# Patient Record
Sex: Male | Born: 1960
Health system: Southern US, Community
[De-identification: ages and names within clinical notes are randomized; demographics above are authoritative.]

## PROBLEM LIST (undated history)

## (undated) DIAGNOSIS — Z789 Other specified health status: Secondary | ICD-10-CM

---

## 2008-09-13 HISTORY — PX: VASECTOMY: SHX75

## 2010-09-13 HISTORY — PX: COLONOSCOPY: SHX174

## 2017-08-10 DIAGNOSIS — C44311 Basal cell carcinoma of skin of nose: Secondary | ICD-10-CM | POA: Diagnosis not present

## 2018-01-16 DIAGNOSIS — L259 Unspecified contact dermatitis, unspecified cause: Secondary | ICD-10-CM | POA: Diagnosis not present

## 2018-01-25 DIAGNOSIS — F5101 Primary insomnia: Secondary | ICD-10-CM | POA: Diagnosis not present

## 2018-01-25 DIAGNOSIS — E782 Mixed hyperlipidemia: Secondary | ICD-10-CM | POA: Diagnosis not present

## 2018-01-25 DIAGNOSIS — R0683 Snoring: Secondary | ICD-10-CM | POA: Diagnosis not present

## 2018-01-25 DIAGNOSIS — R5383 Other fatigue: Secondary | ICD-10-CM | POA: Diagnosis not present

## 2018-01-25 DIAGNOSIS — Z125 Encounter for screening for malignant neoplasm of prostate: Secondary | ICD-10-CM | POA: Diagnosis not present

## 2018-01-25 DIAGNOSIS — N529 Male erectile dysfunction, unspecified: Secondary | ICD-10-CM | POA: Diagnosis not present

## 2018-01-25 DIAGNOSIS — Z0000 Encounter for general adult medical examination without abnormal findings: Secondary | ICD-10-CM | POA: Diagnosis not present

## 2018-04-03 MED FILL — traZODone HCL 50 MG TABS: 50 | 90 days supply | Qty: 90 | Fill #0

## 2018-05-04 MED FILL — ATORVASTATIN CALCIUM 20 MG: 20 | 60 days supply | Qty: 60 | Fill #0

## 2018-07-05 MED FILL — ATORVASTATIN CALCIUM 20 MG: 20 | 60 days supply | Qty: 60 | Fill #1

## 2018-09-14 MED FILL — ATORVASTATIN CALCIUM 20 MG: 20 | 90 days supply | Qty: 90 | Fill #0

## 2018-10-19 DIAGNOSIS — E782 Mixed hyperlipidemia: Secondary | ICD-10-CM | POA: Diagnosis not present

## 2018-10-19 DIAGNOSIS — J069 Acute upper respiratory infection, unspecified: Secondary | ICD-10-CM | POA: Diagnosis not present

## 2018-10-19 DIAGNOSIS — R05 Cough: Secondary | ICD-10-CM | POA: Diagnosis not present

## 2018-10-25 DIAGNOSIS — R05 Cough: Secondary | ICD-10-CM | POA: Diagnosis not present

## 2018-10-25 DIAGNOSIS — J069 Acute upper respiratory infection, unspecified: Secondary | ICD-10-CM | POA: Diagnosis not present

## 2018-10-25 DIAGNOSIS — E782 Mixed hyperlipidemia: Secondary | ICD-10-CM | POA: Diagnosis not present

## 2019-01-11 DIAGNOSIS — M5386 Other specified dorsopathies, lumbar region: Secondary | ICD-10-CM | POA: Diagnosis not present

## 2019-01-11 DIAGNOSIS — M9903 Segmental and somatic dysfunction of lumbar region: Secondary | ICD-10-CM | POA: Diagnosis not present

## 2019-01-11 DIAGNOSIS — M9902 Segmental and somatic dysfunction of thoracic region: Secondary | ICD-10-CM | POA: Diagnosis not present

## 2019-01-11 DIAGNOSIS — M5417 Radiculopathy, lumbosacral region: Secondary | ICD-10-CM | POA: Diagnosis not present

## 2019-01-11 DIAGNOSIS — M9905 Segmental and somatic dysfunction of pelvic region: Secondary | ICD-10-CM | POA: Diagnosis not present

## 2019-01-11 DIAGNOSIS — M5136 Other intervertebral disc degeneration, lumbar region: Secondary | ICD-10-CM | POA: Diagnosis not present

## 2019-01-11 DIAGNOSIS — M62830 Muscle spasm of back: Secondary | ICD-10-CM | POA: Diagnosis not present

## 2019-01-12 DIAGNOSIS — M9902 Segmental and somatic dysfunction of thoracic region: Secondary | ICD-10-CM | POA: Diagnosis not present

## 2019-01-12 DIAGNOSIS — M5386 Other specified dorsopathies, lumbar region: Secondary | ICD-10-CM | POA: Diagnosis not present

## 2019-01-12 DIAGNOSIS — M9905 Segmental and somatic dysfunction of pelvic region: Secondary | ICD-10-CM | POA: Diagnosis not present

## 2019-01-12 DIAGNOSIS — M5417 Radiculopathy, lumbosacral region: Secondary | ICD-10-CM | POA: Diagnosis not present

## 2019-01-12 DIAGNOSIS — M9903 Segmental and somatic dysfunction of lumbar region: Secondary | ICD-10-CM | POA: Diagnosis not present

## 2019-01-12 DIAGNOSIS — M5136 Other intervertebral disc degeneration, lumbar region: Secondary | ICD-10-CM | POA: Diagnosis not present

## 2019-01-12 DIAGNOSIS — M62830 Muscle spasm of back: Secondary | ICD-10-CM | POA: Diagnosis not present

## 2019-01-16 DIAGNOSIS — M9902 Segmental and somatic dysfunction of thoracic region: Secondary | ICD-10-CM | POA: Diagnosis not present

## 2019-01-16 DIAGNOSIS — M62830 Muscle spasm of back: Secondary | ICD-10-CM | POA: Diagnosis not present

## 2019-01-16 DIAGNOSIS — M5386 Other specified dorsopathies, lumbar region: Secondary | ICD-10-CM | POA: Diagnosis not present

## 2019-01-16 DIAGNOSIS — M9903 Segmental and somatic dysfunction of lumbar region: Secondary | ICD-10-CM | POA: Diagnosis not present

## 2019-01-16 DIAGNOSIS — M5136 Other intervertebral disc degeneration, lumbar region: Secondary | ICD-10-CM | POA: Diagnosis not present

## 2019-01-16 DIAGNOSIS — M9905 Segmental and somatic dysfunction of pelvic region: Secondary | ICD-10-CM | POA: Diagnosis not present

## 2019-01-16 DIAGNOSIS — M5417 Radiculopathy, lumbosacral region: Secondary | ICD-10-CM | POA: Diagnosis not present

## 2019-01-19 DIAGNOSIS — M9903 Segmental and somatic dysfunction of lumbar region: Secondary | ICD-10-CM | POA: Diagnosis not present

## 2019-01-19 DIAGNOSIS — M9902 Segmental and somatic dysfunction of thoracic region: Secondary | ICD-10-CM | POA: Diagnosis not present

## 2019-01-19 DIAGNOSIS — M5386 Other specified dorsopathies, lumbar region: Secondary | ICD-10-CM | POA: Diagnosis not present

## 2019-01-19 DIAGNOSIS — M5417 Radiculopathy, lumbosacral region: Secondary | ICD-10-CM | POA: Diagnosis not present

## 2019-01-19 DIAGNOSIS — M9905 Segmental and somatic dysfunction of pelvic region: Secondary | ICD-10-CM | POA: Diagnosis not present

## 2019-01-19 DIAGNOSIS — M5136 Other intervertebral disc degeneration, lumbar region: Secondary | ICD-10-CM | POA: Diagnosis not present

## 2019-01-19 DIAGNOSIS — M62830 Muscle spasm of back: Secondary | ICD-10-CM | POA: Diagnosis not present

## 2019-01-23 MED FILL — ATORVASTATIN 20 MG TABLET: 20 | 90 days supply | Qty: 90 | Fill #0

## 2019-01-26 DIAGNOSIS — M9903 Segmental and somatic dysfunction of lumbar region: Secondary | ICD-10-CM | POA: Diagnosis not present

## 2019-01-26 DIAGNOSIS — M5136 Other intervertebral disc degeneration, lumbar region: Secondary | ICD-10-CM | POA: Diagnosis not present

## 2019-01-26 DIAGNOSIS — M9902 Segmental and somatic dysfunction of thoracic region: Secondary | ICD-10-CM | POA: Diagnosis not present

## 2019-01-26 DIAGNOSIS — M5386 Other specified dorsopathies, lumbar region: Secondary | ICD-10-CM | POA: Diagnosis not present

## 2019-01-26 DIAGNOSIS — M5417 Radiculopathy, lumbosacral region: Secondary | ICD-10-CM | POA: Diagnosis not present

## 2019-01-26 DIAGNOSIS — M62830 Muscle spasm of back: Secondary | ICD-10-CM | POA: Diagnosis not present

## 2019-01-26 DIAGNOSIS — M9905 Segmental and somatic dysfunction of pelvic region: Secondary | ICD-10-CM | POA: Diagnosis not present

## 2019-01-30 DIAGNOSIS — M5417 Radiculopathy, lumbosacral region: Secondary | ICD-10-CM | POA: Diagnosis not present

## 2019-01-30 DIAGNOSIS — M9905 Segmental and somatic dysfunction of pelvic region: Secondary | ICD-10-CM | POA: Diagnosis not present

## 2019-01-30 DIAGNOSIS — M5136 Other intervertebral disc degeneration, lumbar region: Secondary | ICD-10-CM | POA: Diagnosis not present

## 2019-01-30 DIAGNOSIS — M62830 Muscle spasm of back: Secondary | ICD-10-CM | POA: Diagnosis not present

## 2019-01-30 DIAGNOSIS — M9902 Segmental and somatic dysfunction of thoracic region: Secondary | ICD-10-CM | POA: Diagnosis not present

## 2019-01-30 DIAGNOSIS — M9903 Segmental and somatic dysfunction of lumbar region: Secondary | ICD-10-CM | POA: Diagnosis not present

## 2019-01-30 DIAGNOSIS — M5386 Other specified dorsopathies, lumbar region: Secondary | ICD-10-CM | POA: Diagnosis not present

## 2019-01-31 DIAGNOSIS — M9903 Segmental and somatic dysfunction of lumbar region: Secondary | ICD-10-CM | POA: Diagnosis not present

## 2019-01-31 DIAGNOSIS — M5136 Other intervertebral disc degeneration, lumbar region: Secondary | ICD-10-CM | POA: Diagnosis not present

## 2019-01-31 DIAGNOSIS — M5417 Radiculopathy, lumbosacral region: Secondary | ICD-10-CM | POA: Diagnosis not present

## 2019-01-31 DIAGNOSIS — M9905 Segmental and somatic dysfunction of pelvic region: Secondary | ICD-10-CM | POA: Diagnosis not present

## 2019-01-31 DIAGNOSIS — M5386 Other specified dorsopathies, lumbar region: Secondary | ICD-10-CM | POA: Diagnosis not present

## 2019-01-31 DIAGNOSIS — M9902 Segmental and somatic dysfunction of thoracic region: Secondary | ICD-10-CM | POA: Diagnosis not present

## 2019-01-31 DIAGNOSIS — M62830 Muscle spasm of back: Secondary | ICD-10-CM | POA: Diagnosis not present

## 2019-02-07 DIAGNOSIS — M9903 Segmental and somatic dysfunction of lumbar region: Secondary | ICD-10-CM | POA: Diagnosis not present

## 2019-02-07 DIAGNOSIS — M62830 Muscle spasm of back: Secondary | ICD-10-CM | POA: Diagnosis not present

## 2019-02-07 DIAGNOSIS — M9905 Segmental and somatic dysfunction of pelvic region: Secondary | ICD-10-CM | POA: Diagnosis not present

## 2019-02-07 DIAGNOSIS — M9902 Segmental and somatic dysfunction of thoracic region: Secondary | ICD-10-CM | POA: Diagnosis not present

## 2019-02-07 DIAGNOSIS — M5417 Radiculopathy, lumbosacral region: Secondary | ICD-10-CM | POA: Diagnosis not present

## 2019-02-07 DIAGNOSIS — M5386 Other specified dorsopathies, lumbar region: Secondary | ICD-10-CM | POA: Diagnosis not present

## 2019-02-07 DIAGNOSIS — M5136 Other intervertebral disc degeneration, lumbar region: Secondary | ICD-10-CM | POA: Diagnosis not present

## 2019-02-08 DIAGNOSIS — M62830 Muscle spasm of back: Secondary | ICD-10-CM | POA: Diagnosis not present

## 2019-02-08 DIAGNOSIS — M9902 Segmental and somatic dysfunction of thoracic region: Secondary | ICD-10-CM | POA: Diagnosis not present

## 2019-02-08 DIAGNOSIS — M5386 Other specified dorsopathies, lumbar region: Secondary | ICD-10-CM | POA: Diagnosis not present

## 2019-02-08 DIAGNOSIS — M5417 Radiculopathy, lumbosacral region: Secondary | ICD-10-CM | POA: Diagnosis not present

## 2019-02-08 DIAGNOSIS — M9905 Segmental and somatic dysfunction of pelvic region: Secondary | ICD-10-CM | POA: Diagnosis not present

## 2019-02-08 DIAGNOSIS — M9903 Segmental and somatic dysfunction of lumbar region: Secondary | ICD-10-CM | POA: Diagnosis not present

## 2019-02-08 DIAGNOSIS — M5136 Other intervertebral disc degeneration, lumbar region: Secondary | ICD-10-CM | POA: Diagnosis not present

## 2019-02-13 DIAGNOSIS — M5386 Other specified dorsopathies, lumbar region: Secondary | ICD-10-CM | POA: Diagnosis not present

## 2019-02-13 DIAGNOSIS — M9903 Segmental and somatic dysfunction of lumbar region: Secondary | ICD-10-CM | POA: Diagnosis not present

## 2019-02-13 DIAGNOSIS — M62830 Muscle spasm of back: Secondary | ICD-10-CM | POA: Diagnosis not present

## 2019-02-13 DIAGNOSIS — M5136 Other intervertebral disc degeneration, lumbar region: Secondary | ICD-10-CM | POA: Diagnosis not present

## 2019-02-13 DIAGNOSIS — M9905 Segmental and somatic dysfunction of pelvic region: Secondary | ICD-10-CM | POA: Diagnosis not present

## 2019-02-13 DIAGNOSIS — M9902 Segmental and somatic dysfunction of thoracic region: Secondary | ICD-10-CM | POA: Diagnosis not present

## 2019-02-13 DIAGNOSIS — M5417 Radiculopathy, lumbosacral region: Secondary | ICD-10-CM | POA: Diagnosis not present

## 2019-02-16 DIAGNOSIS — M5136 Other intervertebral disc degeneration, lumbar region: Secondary | ICD-10-CM | POA: Diagnosis not present

## 2019-02-16 DIAGNOSIS — M5386 Other specified dorsopathies, lumbar region: Secondary | ICD-10-CM | POA: Diagnosis not present

## 2019-02-16 DIAGNOSIS — M9903 Segmental and somatic dysfunction of lumbar region: Secondary | ICD-10-CM | POA: Diagnosis not present

## 2019-02-16 DIAGNOSIS — M9902 Segmental and somatic dysfunction of thoracic region: Secondary | ICD-10-CM | POA: Diagnosis not present

## 2019-02-16 DIAGNOSIS — M62830 Muscle spasm of back: Secondary | ICD-10-CM | POA: Diagnosis not present

## 2019-02-16 DIAGNOSIS — M5417 Radiculopathy, lumbosacral region: Secondary | ICD-10-CM | POA: Diagnosis not present

## 2019-02-16 DIAGNOSIS — M9905 Segmental and somatic dysfunction of pelvic region: Secondary | ICD-10-CM | POA: Diagnosis not present

## 2019-02-26 DIAGNOSIS — M5136 Other intervertebral disc degeneration, lumbar region: Secondary | ICD-10-CM | POA: Diagnosis not present

## 2019-02-26 DIAGNOSIS — M5417 Radiculopathy, lumbosacral region: Secondary | ICD-10-CM | POA: Diagnosis not present

## 2019-02-26 DIAGNOSIS — M5386 Other specified dorsopathies, lumbar region: Secondary | ICD-10-CM | POA: Diagnosis not present

## 2019-02-26 DIAGNOSIS — M9902 Segmental and somatic dysfunction of thoracic region: Secondary | ICD-10-CM | POA: Diagnosis not present

## 2019-02-26 DIAGNOSIS — M62830 Muscle spasm of back: Secondary | ICD-10-CM | POA: Diagnosis not present

## 2019-02-26 DIAGNOSIS — M9905 Segmental and somatic dysfunction of pelvic region: Secondary | ICD-10-CM | POA: Diagnosis not present

## 2019-02-26 DIAGNOSIS — M9903 Segmental and somatic dysfunction of lumbar region: Secondary | ICD-10-CM | POA: Diagnosis not present

## 2019-03-14 DIAGNOSIS — R202 Paresthesia of skin: Secondary | ICD-10-CM | POA: Diagnosis not present

## 2019-03-14 DIAGNOSIS — M4316 Spondylolisthesis, lumbar region: Secondary | ICD-10-CM | POA: Diagnosis not present

## 2019-03-14 DIAGNOSIS — M5416 Radiculopathy, lumbar region: Secondary | ICD-10-CM | POA: Diagnosis not present

## 2019-03-14 DIAGNOSIS — M545 Low back pain: Secondary | ICD-10-CM | POA: Diagnosis not present

## 2019-03-20 ENCOUNTER — Other Ambulatory Visit: Payer: Self-pay | Admitting: Neurosurgery

## 2019-03-20 ENCOUNTER — Other Ambulatory Visit (HOSPITAL_COMMUNITY): Payer: Self-pay | Admitting: Neurosurgery

## 2019-03-20 DIAGNOSIS — M5416 Radiculopathy, lumbar region: Secondary | ICD-10-CM

## 2019-04-05 ENCOUNTER — Other Ambulatory Visit: Payer: Self-pay

## 2019-04-05 ENCOUNTER — Ambulatory Visit (HOSPITAL_COMMUNITY)
Admission: RE | Admit: 2019-04-05 | Discharge: 2019-04-05 | Disposition: A | Discharge status: Home / Self Care | Payer: 59 | Source: Ambulatory Visit | Attending: Neurosurgery | Admitting: Neurosurgery

## 2019-04-05 DIAGNOSIS — M5416 Radiculopathy, lumbar region: Secondary | ICD-10-CM

## 2019-04-05 DIAGNOSIS — M545 Low back pain: Secondary | ICD-10-CM | POA: Diagnosis not present

## 2019-05-09 DIAGNOSIS — N529 Male erectile dysfunction, unspecified: Secondary | ICD-10-CM | POA: Diagnosis not present

## 2019-05-09 DIAGNOSIS — E782 Mixed hyperlipidemia: Secondary | ICD-10-CM | POA: Diagnosis not present

## 2019-05-09 DIAGNOSIS — F5101 Primary insomnia: Secondary | ICD-10-CM | POA: Diagnosis not present

## 2019-05-09 MED FILL — ATORVASTATIN 20 MG TABLET: 20 | 90 days supply | Qty: 90 | Fill #0

## 2019-05-31 DIAGNOSIS — M4317 Spondylolisthesis, lumbosacral region: Secondary | ICD-10-CM | POA: Diagnosis not present

## 2019-05-31 DIAGNOSIS — M5417 Radiculopathy, lumbosacral region: Secondary | ICD-10-CM | POA: Diagnosis not present

## 2019-05-31 DIAGNOSIS — M4306 Spondylolysis, lumbar region: Secondary | ICD-10-CM | POA: Diagnosis not present

## 2019-06-15 DIAGNOSIS — M5417 Radiculopathy, lumbosacral region: Secondary | ICD-10-CM | POA: Diagnosis not present

## 2019-06-28 DIAGNOSIS — M5417 Radiculopathy, lumbosacral region: Secondary | ICD-10-CM | POA: Diagnosis not present

## 2019-07-02 DIAGNOSIS — M5417 Radiculopathy, lumbosacral region: Secondary | ICD-10-CM | POA: Diagnosis not present

## 2019-08-13 DIAGNOSIS — M5417 Radiculopathy, lumbosacral region: Secondary | ICD-10-CM | POA: Diagnosis not present

## 2019-08-13 DIAGNOSIS — M5416 Radiculopathy, lumbar region: Secondary | ICD-10-CM | POA: Diagnosis not present

## 2019-08-13 MED FILL — ATORVASTATIN 20 MG TABLET: 20 | 90 days supply | Qty: 90 | Fill #1

## 2019-08-30 DIAGNOSIS — M4306 Spondylolysis, lumbar region: Secondary | ICD-10-CM | POA: Diagnosis not present

## 2019-08-30 DIAGNOSIS — M5417 Radiculopathy, lumbosacral region: Secondary | ICD-10-CM | POA: Diagnosis not present

## 2019-08-30 DIAGNOSIS — M4317 Spondylolisthesis, lumbosacral region: Secondary | ICD-10-CM | POA: Diagnosis not present

## 2019-09-28 DIAGNOSIS — M4317 Spondylolisthesis, lumbosacral region: Secondary | ICD-10-CM | POA: Diagnosis not present

## 2019-09-28 DIAGNOSIS — Z01812 Encounter for preprocedural laboratory examination: Secondary | ICD-10-CM | POA: Diagnosis not present

## 2019-09-28 DIAGNOSIS — M5417 Radiculopathy, lumbosacral region: Secondary | ICD-10-CM | POA: Diagnosis not present

## 2019-09-28 DIAGNOSIS — Z20822 Contact with and (suspected) exposure to covid-19: Secondary | ICD-10-CM | POA: Diagnosis not present

## 2019-10-08 DIAGNOSIS — M4802 Spinal stenosis, cervical region: Secondary | ICD-10-CM | POA: Diagnosis not present

## 2019-10-08 DIAGNOSIS — M4722 Other spondylosis with radiculopathy, cervical region: Secondary | ICD-10-CM | POA: Diagnosis not present

## 2019-10-17 DIAGNOSIS — M542 Cervicalgia: Secondary | ICD-10-CM | POA: Diagnosis not present

## 2019-10-17 DIAGNOSIS — R29898 Other symptoms and signs involving the musculoskeletal system: Secondary | ICD-10-CM | POA: Diagnosis not present

## 2019-10-17 DIAGNOSIS — M5412 Radiculopathy, cervical region: Secondary | ICD-10-CM | POA: Diagnosis not present

## 2019-10-22 DIAGNOSIS — M542 Cervicalgia: Secondary | ICD-10-CM | POA: Diagnosis not present

## 2019-10-22 DIAGNOSIS — M5412 Radiculopathy, cervical region: Secondary | ICD-10-CM | POA: Diagnosis not present

## 2019-10-22 DIAGNOSIS — M5417 Radiculopathy, lumbosacral region: Secondary | ICD-10-CM | POA: Diagnosis not present

## 2019-10-22 DIAGNOSIS — R29898 Other symptoms and signs involving the musculoskeletal system: Secondary | ICD-10-CM | POA: Diagnosis not present

## 2019-10-24 DIAGNOSIS — M5417 Radiculopathy, lumbosacral region: Secondary | ICD-10-CM | POA: Diagnosis not present

## 2019-10-24 DIAGNOSIS — M542 Cervicalgia: Secondary | ICD-10-CM | POA: Diagnosis not present

## 2019-10-24 DIAGNOSIS — R29898 Other symptoms and signs involving the musculoskeletal system: Secondary | ICD-10-CM | POA: Diagnosis not present

## 2019-10-24 DIAGNOSIS — M5412 Radiculopathy, cervical region: Secondary | ICD-10-CM | POA: Diagnosis not present

## 2019-11-08 DIAGNOSIS — R29898 Other symptoms and signs involving the musculoskeletal system: Secondary | ICD-10-CM | POA: Diagnosis not present

## 2019-11-08 DIAGNOSIS — M5417 Radiculopathy, lumbosacral region: Secondary | ICD-10-CM | POA: Diagnosis not present

## 2019-11-08 DIAGNOSIS — M4317 Spondylolisthesis, lumbosacral region: Secondary | ICD-10-CM | POA: Diagnosis not present

## 2019-11-08 DIAGNOSIS — M5412 Radiculopathy, cervical region: Secondary | ICD-10-CM | POA: Diagnosis not present

## 2019-11-08 DIAGNOSIS — M542 Cervicalgia: Secondary | ICD-10-CM | POA: Diagnosis not present

## 2019-11-12 ENCOUNTER — Other Ambulatory Visit: Payer: Self-pay | Admitting: *Deleted

## 2019-11-12 NOTE — Patient Outreach (Addendum)
[facility]) Care Management  11/12/2019  Andrew Montes November 25, 1960 [id number]   The following transition of care referral received 09/24/19.  Scheduled Admit date : 10/05/19 Dx: Lumbar Radiculopathy  At: [facility]   Per Chart review , Lumbar surgery for 10/05/19 was cancelled, so will close case to [place] Management services since transition of care assessment no longer needed.    Plan  Joylene Draft, RN, BSN  [place] Management Coordinator  [phone]- Mobile [phone]- Toll Free Main Office

## 2019-11-19 MED FILL — ATORVASTATIN 20 MG TABLET: 20 | 90 days supply | Qty: 90 | Fill #2

## 2020-02-29 MED FILL — ATORVASTATIN 20 MG TABLET: 20 | 90 days supply | Qty: 90 | Fill #3

## 2020-05-07 DIAGNOSIS — R0683 Snoring: Secondary | ICD-10-CM | POA: Diagnosis not present

## 2020-05-07 DIAGNOSIS — F5101 Primary insomnia: Secondary | ICD-10-CM | POA: Diagnosis not present

## 2020-05-07 DIAGNOSIS — G4719 Other hypersomnia: Secondary | ICD-10-CM | POA: Diagnosis not present

## 2020-05-07 DIAGNOSIS — R5383 Other fatigue: Secondary | ICD-10-CM | POA: Diagnosis not present

## 2020-05-07 MED FILL — ZOLPIDEM TARTRATE 5 MG TAB: 5 | 30 days supply | Qty: 30 | Fill #0

## 2020-06-16 DIAGNOSIS — Z23 Encounter for immunization: Secondary | ICD-10-CM | POA: Diagnosis not present

## 2020-06-16 DIAGNOSIS — E782 Mixed hyperlipidemia: Secondary | ICD-10-CM | POA: Diagnosis not present

## 2020-06-24 ENCOUNTER — Other Ambulatory Visit (HOSPITAL_COMMUNITY): Payer: Self-pay | Admitting: Family Medicine

## 2020-06-26 MED FILL — ATORVASTATIN 20 MG TABLET: 20 | 90 days supply | Qty: 90 | Fill #0

## 2020-08-27 DIAGNOSIS — L989 Disorder of the skin and subcutaneous tissue, unspecified: Secondary | ICD-10-CM | POA: Diagnosis not present

## 2020-09-30 DIAGNOSIS — D0439 Carcinoma in situ of skin of other parts of face: Secondary | ICD-10-CM | POA: Diagnosis not present

## 2020-10-01 MED FILL — ATORVASTATIN CALCIUM 20 MG: 20 | 90 days supply | Qty: 90 | Fill #1

## 2020-10-14 MED FILL — ATORVASTATIN CALCIUM 20 MG: 20 | 90 days supply | Qty: 90 | Fill #1

## 2020-11-04 DIAGNOSIS — L905 Scar conditions and fibrosis of skin: Secondary | ICD-10-CM | POA: Diagnosis not present

## 2020-11-04 DIAGNOSIS — L989 Disorder of the skin and subcutaneous tissue, unspecified: Secondary | ICD-10-CM | POA: Diagnosis not present

## 2020-11-04 DIAGNOSIS — D0439 Carcinoma in situ of skin of other parts of face: Secondary | ICD-10-CM | POA: Diagnosis not present

## 2020-11-21 IMAGING — MR MRI LUMBAR SPINE WITHOUT CONTRAST
4 of 5 series · 19 of 48 positions shown · non-contrast
Comparison: Lumbar spine x-rays dated March 14, 2019.

CLINICAL DATA: Low back pain with occasional numbness down both
legs for the past few months.

EXAM:
MRI LUMBAR SPINE WITHOUT CONTRAST
TECHNIQUE: Multiplanar, multisequence MR imaging of the lumbar spine was
performed. No intravenous contrast was administered.

[Series 2: T2 · sagittal · 4.0mm · 0.55mm/px · 6 of 13 slices shown (1 of 2)]
[im 1/13]
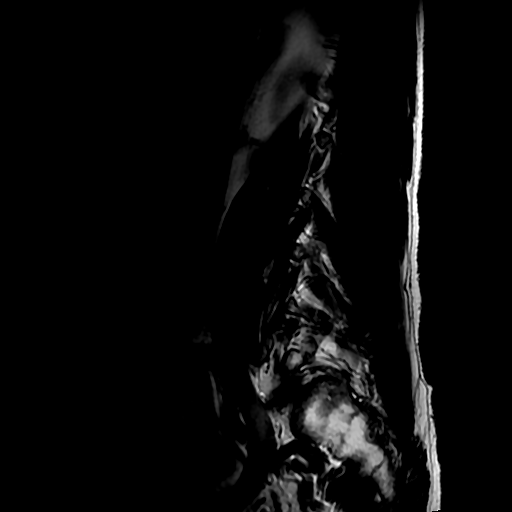
[im 3/13]
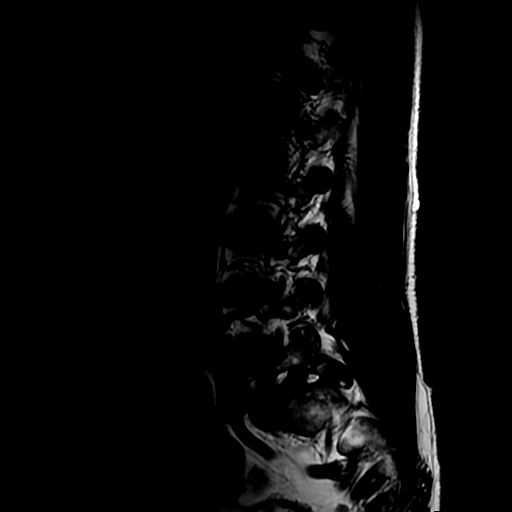
[im 5/13]
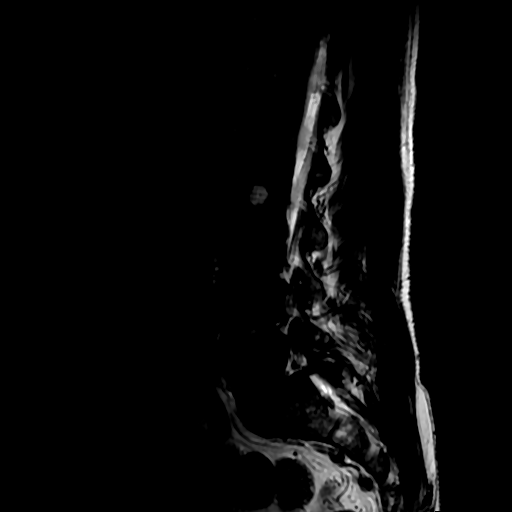
[im 8/13]
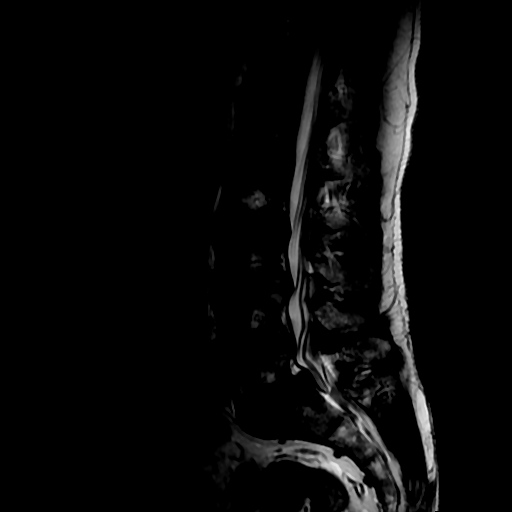
[im 10/13]
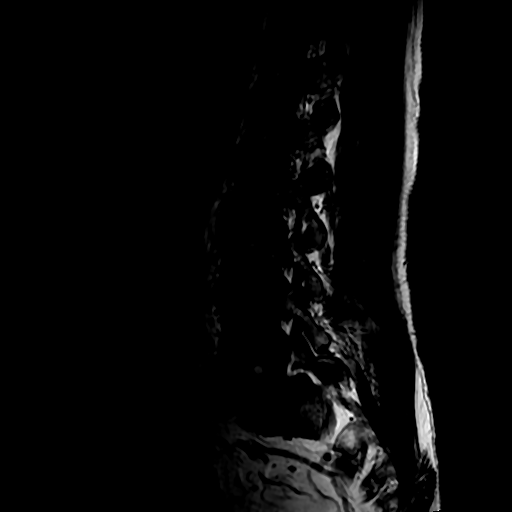
[im 13/13]
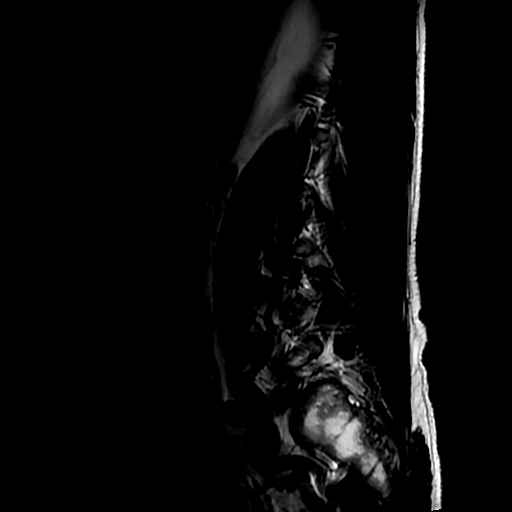

[Series 4: T1 · sagittal · 4.0mm · 0.55mm/px · 3 of 13 slices shown (1 of 2)]
[im 1/13]
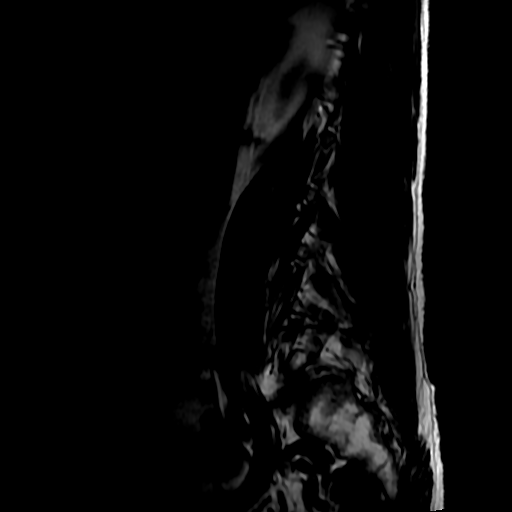
[im 7/13]
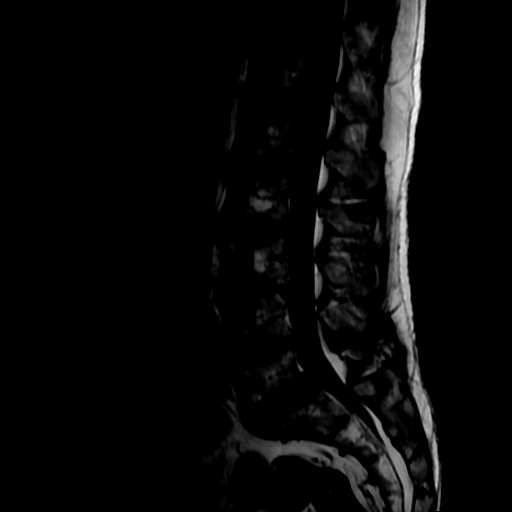
[im 13/13]
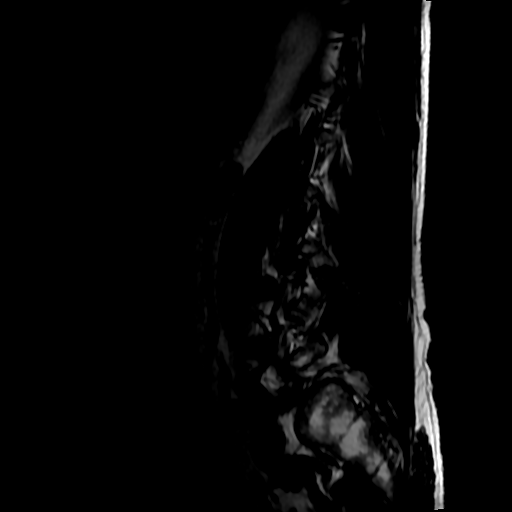

[Series 5: T2 · axial · 4.0mm · 0.39mm/px · z∈[-108,+50]mm · 7 of 39 slices shown (2 of 2)]
[im 3/39]
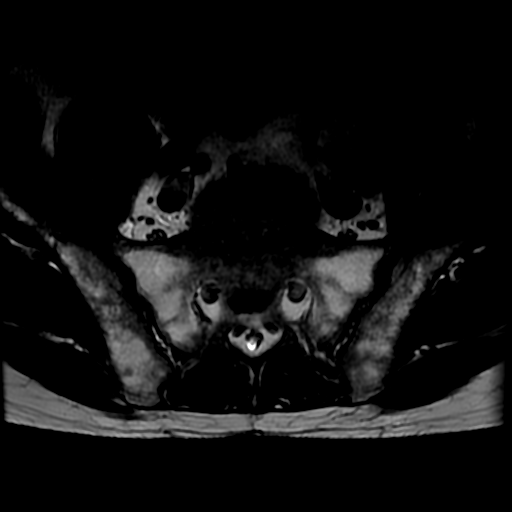
[im 6/39]
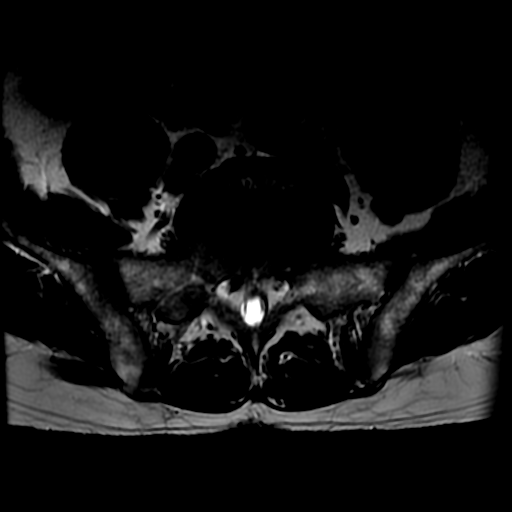
[im 8/39]
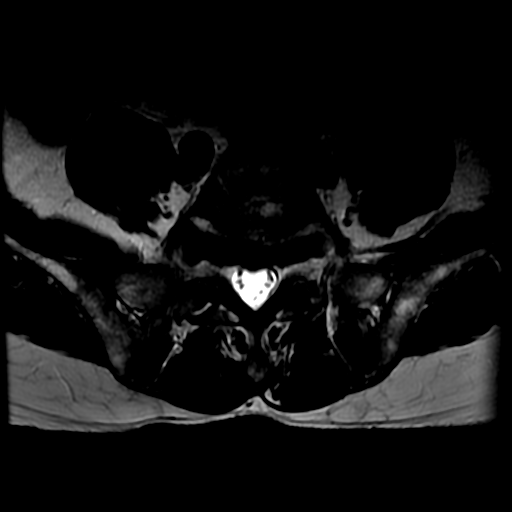
[im 13/39]
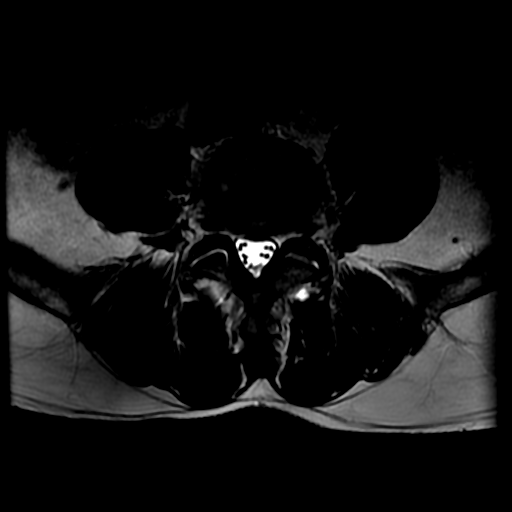
[im 18/39]
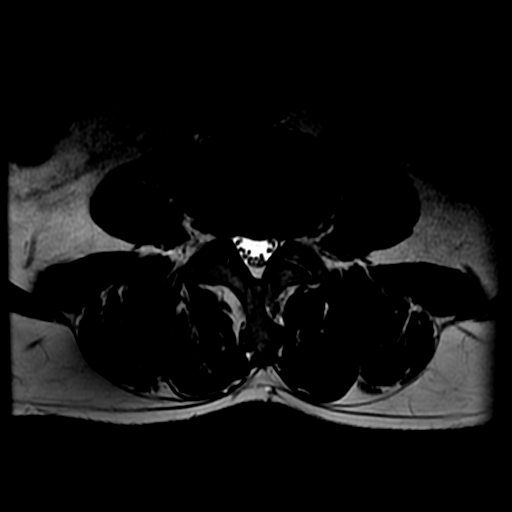
[im 21/39]
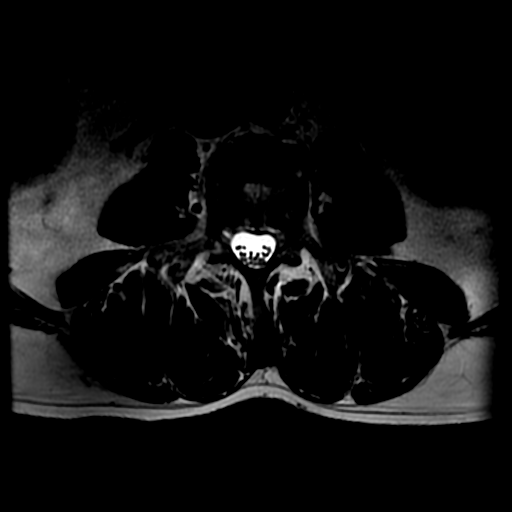
[im 33/39]
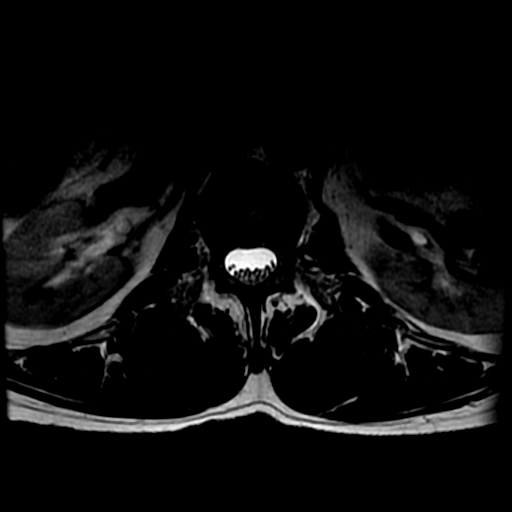

[Series 6: T1 · axial · 4.0mm · 0.39mm/px · z∈[-93,+50]mm · 3 of 39 slices shown (2 of 2)]
[im 6/39]
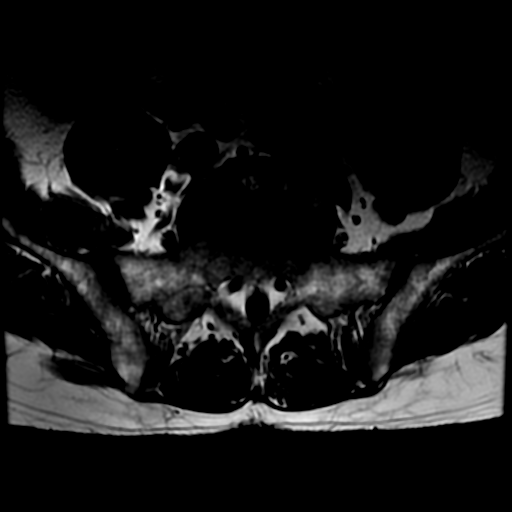
[im 21/39]
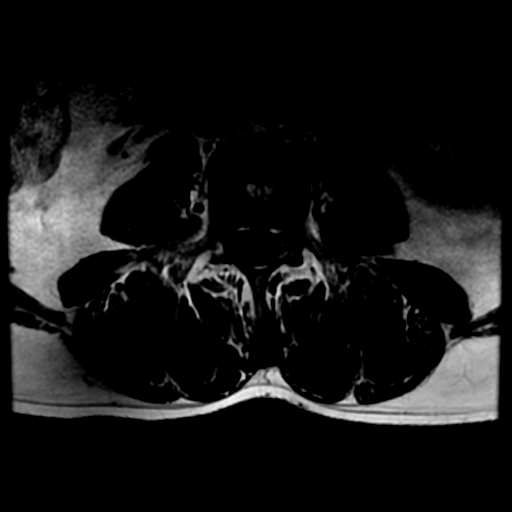
[im 33/39]
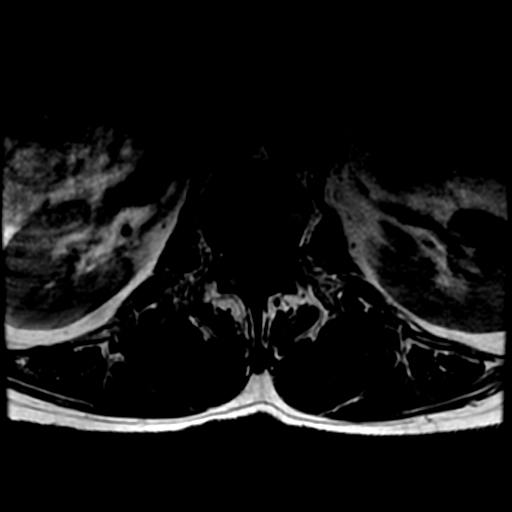

[19 of 48 positions shown; findings below may reference images not displayed]

FINDINGS: Segmentation:  Standard.

Alignment:  Unchanged 5 mm anterolisthesis at L5-S1.

Vertebrae: Mild marrow heterogeneity without focal suspicious
lesion. Scattered small hemangiomas. No acute fracture or evidence
of discitis.

Conus medullaris and cauda equina: Conus extends to the L1 level.
Conus and cauda equina appear normal.

Paraspinal and other soft tissues: Negative.

Disc levels:

T12-L1:  Negative.

L1-L2:  Negative.

L2-L3: Minimal disc bulging with small central annular fissure. No
stenosis.

L3-L4: Moderate disc height loss with mild diffuse disc bulging. No
stenosis.

L4-L5: Mild disc bulging. Mild bilateral neuroforaminal stenosis. No
spinal canal stenosis.

L5-S1: Chronic bilateral L5 pars defects. Disc uncovering with mild
diffuse disc bulging. Moderate to severe bilateral neuroforaminal
stenosis. No spinal canal stenosis.
IMPRESSION: 1. Chronic bilateral L5 pars defects with grade 1 anterolisthesis at
L5-S1. Resultant moderate to severe bilateral neuroforaminal
stenosis.
2. Mild degenerative disc disease throughout the remaining lumbar
spine. Mild bilateral neuroforaminal stenosis at L4-L5.

## 2020-12-15 DIAGNOSIS — Z125 Encounter for screening for malignant neoplasm of prostate: Secondary | ICD-10-CM | POA: Diagnosis not present

## 2020-12-15 DIAGNOSIS — N529 Male erectile dysfunction, unspecified: Secondary | ICD-10-CM | POA: Diagnosis not present

## 2020-12-15 DIAGNOSIS — Z1211 Encounter for screening for malignant neoplasm of colon: Secondary | ICD-10-CM | POA: Diagnosis not present

## 2020-12-15 DIAGNOSIS — F5101 Primary insomnia: Secondary | ICD-10-CM | POA: Diagnosis not present

## 2020-12-15 DIAGNOSIS — E782 Mixed hyperlipidemia: Secondary | ICD-10-CM | POA: Diagnosis not present

## 2020-12-15 DIAGNOSIS — Z0000 Encounter for general adult medical examination without abnormal findings: Secondary | ICD-10-CM | POA: Diagnosis not present

## 2020-12-15 DIAGNOSIS — Z1159 Encounter for screening for other viral diseases: Secondary | ICD-10-CM | POA: Diagnosis not present

## 2021-01-21 ENCOUNTER — Other Ambulatory Visit (HOSPITAL_COMMUNITY): Payer: Self-pay

## 2021-01-21 MED FILL — Atorvastatin Calcium Tab 20 MG (Base Equivalent): ORAL | 90 days supply | Qty: 90 | Fill #0 | Status: AC

## 2021-01-22 ENCOUNTER — Other Ambulatory Visit (HOSPITAL_COMMUNITY): Payer: Self-pay

## 2021-04-27 ENCOUNTER — Telehealth: Payer: Self-pay | Admitting: *Deleted

## 2021-04-27 NOTE — Telephone Encounter (Signed)
Scheduled nurse triage visit by telephone for 05/13/2021 at 4:00.

## 2021-04-27 NOTE — Telephone Encounter (Signed)
Per Dr. Abbey Chatters patient needs triage for TCS propofol. Patient requests 9/16 at 7:30am, 10 yr colonoscopy. 7:30am not available so added at 8:00am for you

## 2021-04-30 ENCOUNTER — Other Ambulatory Visit (HOSPITAL_COMMUNITY): Payer: Self-pay

## 2021-04-30 MED FILL — Atorvastatin Calcium Tab 20 MG (Base Equivalent): ORAL | 90 days supply | Qty: 90 | Fill #1 | Status: AC

## 2021-05-13 ENCOUNTER — Ambulatory Visit: Payer: Self-pay

## 2021-05-13 NOTE — Telephone Encounter (Signed)
Tried to call pt a couple times for phone visit.  LMOM for pt to call me back.

## 2021-05-14 NOTE — Telephone Encounter (Addendum)
Spoke to pt.  He rescheduled nurse phone visit for 05/21/2021 at 4:00.

## 2021-05-20 ENCOUNTER — Ambulatory Visit: Payer: 59

## 2021-05-21 ENCOUNTER — Ambulatory Visit (INDEPENDENT_AMBULATORY_CARE_PROVIDER_SITE_OTHER): Payer: Self-pay | Admitting: *Deleted

## 2021-05-21 ENCOUNTER — Other Ambulatory Visit (HOSPITAL_COMMUNITY): Payer: Self-pay

## 2021-05-21 ENCOUNTER — Other Ambulatory Visit: Payer: Self-pay

## 2021-05-21 VITALS — Ht 67.0 in | Wt 170.0 lb

## 2021-05-21 DIAGNOSIS — Z1211 Encounter for screening for malignant neoplasm of colon: Secondary | ICD-10-CM

## 2021-05-21 MED ORDER — CLENPIQ 10-3.5-12 MG-GM -GM/160ML PO SOLN
1.0000 | Freq: Once | ORAL | 0 refills | Status: AC
  Filled 2021-05-21: qty 320, 1d supply, fill #0

## 2021-05-21 NOTE — Progress Notes (Signed)
OK to schedule. ASA II 

## 2021-05-21 NOTE — Patient Instructions (Signed)
[place]   Patient Name:  Andrew Montes Date of procedure:  05/29/2021 Time to register at [facility] Stay:  6:30 am Provider:  Dr. Abbey Chatters  Please notify us immediately if you are diabetic, take iron supplements, or if you are on coumadin or any blood thinners.  Please hold the following medications: n/a  Note: Do NOT refrigerate or freeze CLENPIQ. CLENPIQ is ready to drink. There is no need to add any other liquid or mix the medicine in the bottle before you start dosing.   05/28/2021-  1 Day prior to procedure:    CLEAR LIQUIDS ALL DAY--NO SOLID FOODS OR DAIRY PRODUCTS! See list of liquids that are allowed and items that are NOT allowed below.  Diabetic Medication Instructions:  n/a  You must drink plenty of CLEAR LIQUIDS starting before your bowel prep. It is important to stay adequately hydrated before, during, and after your bowel prep for the prep to work effectively!  At 4:00 PM Begin the prep as follows:    Drink one bottle of pre-mixed CLENPIQ right from the bottle.  Drink at least five (5) 8-ounce drinks of clear liquids of your choice within the next 5 hours  At 10:00 PM: Drink the second bottle of pre-mixed CLENPIQ right from the bottle.   Drink at least three (3) 8-ounce drinks of clear liquids of your choice within the next 3 hours before going to bed.  Continue clear liquids.    05/29/2021-  Day of Procedure:  Diabetic medications adjustments: n/a  You may take TYLENOL products.  Please continue your regular medications unless we have instructed you otherwise.    At 3 hours before procedure @ 5:00 am: Stop drinking all liquids, nothing by mouth at this point.   Please note, on the day of your procedure you MUST be accompanied by an adult who is willing to assume responsibility for you at time of discharge. If you do not have such person with you, your procedure will have to be rescheduled.                                                                                                                      Please leave ALL jewelry at home prior to coming to the hospital for your procedure.   *It is your responsibility to check with your insurance company for the benefits of coverage you have for this procedure. Unfortunately, not all insurance companies have benefits to cover all or part of these types of procedures. It is your responsibility to check your benefits, however we will be glad to assist you with any codes your insurance company may need.   Please note that most insurance companies will not cover a screening colonoscopy for people under the age of 50  For example, with some insurance companies you may have benefits for a screening colonoscopy, but if polyps are found the diagnosis will change and then you may have a deductible that will need to be met.  Please make sure you check your benefits for screening colonoscopy as well as a diagnostic colonoscopy.    CLEAR LIQUIDS: (NO RED or PURPLE) Water  Jello   Apple Juice  White Grape Juice   Kool-Aid Soft drinks  Banana popsicles Sports Drink  Black coffee (No cream or milk) Tea (No cream or milk)  Broth (fat free beef/chicken/vegetable)  Clear liquids allow you to see your fingers on the other side of the glass.  Be sure they are NOT RED or PURPLE in color, cloudy, but CLEAR.  Do Not Eat: Dairy products of any kind Cranberry juice Tomato or V8 Juice  Orange Juice   Grapefruit Juice Red Grape Juice Alcohol   Non-dairy creamer Solid foods like cereal, oatmeal, yogurt, fruits, vegetables, creamed soups, eggs, bread, etc   HELPFUL HINTS TO MAKE DRINKING EASIER: -Trying drinking through a straw. -If you become nauseated, try consuming smaller amounts or stretch out the time between glasses.  Stop for 30 minutes & slowly start back drinking.  Call our office with any questions or concerns at [phone].  Thank You,  Christ Kick, [place]

## 2021-05-21 NOTE — Progress Notes (Addendum)
Gastroenterology Pre-Procedure Review  Request Date: 05/21/2021 Requesting Physician: triage per Dr. Abbey Chatters, Last TCS 2010 done at [facility] by Dr. Amalia Hailey per pt, hx of polyps per pt  PATIENT REVIEW QUESTIONS: The patient responded to the following health history questions as indicated:    1. Diabetes Melitis: no 2. Joint replacements in the past 12 months: no 3. Major health problems in the past 3 months: no 4. Has an artificial valve or MVP: no 5. Has a defibrillator: no 6. Has been advised in past to take antibiotics in advance of a procedure like teeth cleaning: no 7. Family history of colon cancer: no  8. Alcohol Use: yes, 2 drinks per day 9. Illicit drug Use: no 10. History of sleep apnea: no  11. History of coronary artery or other vascular stents placed within the last 12 months: no 12. History of any prior anesthesia complications: no 13. Body mass index is 26.63 kg/m.    MEDICATIONS & ALLERGIES:    Patient reports the following regarding taking any blood thinners:   Plavix? no Aspirin? no Coumadin? no Brilinta? no Xarelto? no Eliquis? no Pradaxa? no Savaysa? no Effient? no  Patient confirms/reports the following medications:  Current Outpatient Medications  Medication Sig Dispense Refill   atorvastatin (LIPITOR) 20 MG tablet TAKE 1 TABLET BY MOUTH ONCE DAILY. 90 tablet 3   Cyanocobalamin (B-12) 3000 MCG CAPS Take by mouth daily at 6 (six) AM.     Multiple Vitamins-Minerals (MULTIVITAMIN ADULTS PO) Take by mouth daily at 6 (six) AM.     No current facility-administered medications for this visit.    Patient confirms/reports the following allergies:  Allergies  Allergen Reactions   Nystatin Rash    No orders of the defined types were placed in this encounter.   AUTHORIZATION INFORMATION Primary Insurance: [employer]ac[employer] Pont[employer] [place],  [place] #: [id number],  Group #: [id number] Pre-Cert / Josem Kaufmann required: No, not required per [place] / Auth #: [id number]  SCHEDULE  INFORMATION: Procedure has been scheduled as follows:  Date: 05/29/2021, Time: 8:00  Location: [facility] with Dr. Abbey Chatters  This Gastroenterology Pre-Precedure Review Form is being routed to the following provider(s): Aliene Altes, PA-C

## 2021-05-22 ENCOUNTER — Encounter: Payer: Self-pay | Admitting: *Deleted

## 2021-05-22 NOTE — Progress Notes (Addendum)
Pt made aware that he would need a pre-op appointment prior to procedure.  Pt requested prep instructions and pre-op letter to be emailed to him at [email or web address].  Emailed accordingly.

## 2021-05-25 NOTE — Patient Instructions (Signed)
SHYHEEM OSHINSKI  05/25/2021     '@PREFPERIOPPHARMACY'$ @   Your procedure is scheduled on  05/29/2021.   Report to [facility] at  [phone] A.M.   Call this number if you have problems the morning of surgery:  [phone]   Remember:  Follow the diet and prep  instructions given to you by the office.    Take these medicines the morning of surgery with A SIP OF WATER        None     Do not wear jewelry, make-up or nail polish.  Do not wear lotions, powders, or perfumes, or deodorant.  Do not shave 48 hours prior to surgery.  Men may shave face and neck.  Do not bring valuables to the hospital.  [facility] is not responsible for any belongings or valuables.  Contacts, dentures or bridgework may not be worn into surgery.  Leave your suitcase in the car.  After surgery it may be brought to your room.  For patients admitted to the hospital, discharge time will be determined by your treatment team.  Patients discharged the day of surgery will not be allowed to drive home and must have someone with them for 24 hours.    Special instructions:   DO NOT smoke tobacco or vape fore 24 hours before your procedure.  Please read over the following fact sheets that you were given. Anesthesia Post-op Instructions and Care and Recovery After Surgery      Colonoscopy, Adult, Care After This sheet gives you information about how to care for yourself after your procedure. Your health care provider may also give you more specific instructions. If you have problems or questions, contact your health care provider. What can I expect after the procedure? After the procedure, it is common to have: A small amount of blood in your stool for 24 hours after the procedure. Some gas. Mild cramping or bloating of your abdomen. Follow these instructions at home: Eating and drinking  Drink enough fluid to keep your urine pale yellow. Follow instructions from your health care provider about eating or  drinking restrictions. Resume your normal diet as instructed by your health care provider. Avoid heavy or fried foods that are hard to digest. Activity Rest as told by your health care provider. Avoid sitting for a long time without moving. Get up to take short walks every 1-2 hours. This is important to improve blood flow and breathing. Ask for help if you feel weak or unsteady. Return to your normal activities as told by your health care provider. Ask your health care provider what activities are safe for you. Managing cramping and bloating  Try walking around when you have cramps or feel bloated. Apply heat to your abdomen as told by your health care provider. Use the heat source that your health care provider recommends, such as a moist heat pack or a heating pad. Place a towel between your skin and the heat source. Leave the heat on for 20-30 minutes. Remove the heat if your skin turns bright red. This is especially important if you are unable to feel pain, heat, or cold. You may have a greater risk of getting burned. General instructions If you were given a sedative during the procedure, it can affect you for several hours. Do not drive or operate machinery until your health care provider says that it is safe. For the first 24 hours after the procedure: Do not sign important documents.  Do not drink alcohol. Do your regular daily activities at a slower pace than normal. Eat soft foods that are easy to digest. Take over-the-counter and prescription medicines only as told by your health care provider. Keep all follow-up visits as told by your health care provider. This is important. Contact a health care provider if: You have blood in your stool 2-3 days after the procedure. Get help right away if you have: More than a small spotting of blood in your stool. Large blood clots in your stool. Swelling of your abdomen. Nausea or vomiting. A fever. Increasing pain in your abdomen that is  not relieved with medicine. Summary After the procedure, it is common to have a small amount of blood in your stool. You may also have mild cramping and bloating of your abdomen. If you were given a sedative during the procedure, it can affect you for several hours. Do not drive or operate machinery until your health care provider says that it is safe. Get help right away if you have a lot of blood in your stool, nausea or vomiting, a fever, or increased pain in your abdomen. This information is not intended to replace advice given to you by your health care provider. Make sure you discuss any questions you have with your health care provider. Document Revised: 08/24/2019 Document Reviewed: 03/26/2019 Elsevier Patient Education  [place] After This sheet gives you information about how to care for yourself after your procedure. Your health care provider may also give you more specific instructions. If you have problems or questions, contact your health care provider. What can I expect after the procedure? After the procedure, it is common to have: Tiredness. Forgetfulness about what happened after the procedure. Impaired judgment for important decisions. Nausea or vomiting. Some difficulty with balance. Follow these instructions at home: For the time period you were told by your health care provider:   Rest as needed. Do not participate in activities where you could fall or become injured. Do not drive or use machinery. Do not drink alcohol. Do not take sleeping pills or medicines that cause drowsiness. Do not make important decisions or sign legal documents. Do not take care of children on your own. Eating and drinking Follow the diet that is recommended by your health care provider. Drink enough fluid to keep your urine pale yellow. If you vomit: Drink water, juice, or soup when you can drink without vomiting. Make sure you have little or  no nausea before eating solid foods. General instructions Have a responsible adult stay with you for the time you are told. It is important to have someone help care for you until you are awake and alert. Take over-the-counter and prescription medicines only as told by your health care provider. If you have sleep apnea, surgery and certain medicines can increase your risk for breathing problems. Follow instructions from your health care provider about wearing your sleep device: Anytime you are sleeping, including during daytime naps. While taking prescription pain medicines, sleeping medicines, or medicines that make you drowsy. Avoid smoking. Keep all follow-up visits as told by your health care provider. This is important. Contact a health care provider if: You keep feeling nauseous or you keep vomiting. You feel light-headed. You are still sleepy or having trouble with balance after 24 hours. You develop a rash. You have a fever. You have redness or swelling around the IV site. Get help right away if: You have trouble breathing.  You have new-onset confusion at home. Summary For several hours after your procedure, you may feel tired. You may also be forgetful and have poor judgment. Have a responsible adult stay with you for the time you are told. It is important to have someone help care for you until you are awake and alert. Rest as told. Do not drive or operate machinery. Do not drink alcohol or take sleeping pills. Get help right away if you have trouble breathing, or if you suddenly become confused. This information is not intended to replace advice given to you by your health care provider. Make sure you discuss any questions you have with your health care provider. Document Revised: 05/15/2020 Document Reviewed: 08/02/2019 Elsevier Patient Education  2022 [employer].

## 2021-05-26 ENCOUNTER — Encounter: Payer: Self-pay | Admitting: *Deleted

## 2021-05-26 ENCOUNTER — Encounter (HOSPITAL_COMMUNITY): Payer: Self-pay

## 2021-05-26 ENCOUNTER — Other Ambulatory Visit (HOSPITAL_COMMUNITY): Payer: Self-pay

## 2021-05-27 ENCOUNTER — Other Ambulatory Visit: Payer: Self-pay

## 2021-05-27 ENCOUNTER — Encounter (HOSPITAL_COMMUNITY)
Admission: RE | Admit: 2021-05-27 | Discharge: 2021-05-27 | Disposition: A | Discharge status: Home / Self Care | Payer: 59 | Source: Ambulatory Visit | Attending: Internal Medicine | Admitting: Internal Medicine

## 2021-05-27 HISTORY — DX: Other specified health status: Z78.9

## 2021-05-29 ENCOUNTER — Ambulatory Visit (HOSPITAL_COMMUNITY)
Admission: RE | Admit: 2021-05-29 | Discharge: 2021-05-29 | Disposition: A | Discharge status: Home / Self Care | Payer: 59 | Attending: Internal Medicine | Admitting: Internal Medicine

## 2021-05-29 ENCOUNTER — Ambulatory Visit (HOSPITAL_COMMUNITY): Payer: 59 | Admitting: Certified Registered"

## 2021-05-29 ENCOUNTER — Encounter (HOSPITAL_COMMUNITY)
Admission: RE | Disposition: A | Discharge status: Home / Self Care | Payer: Self-pay | Source: Home / Self Care | Attending: Internal Medicine

## 2021-05-29 ENCOUNTER — Other Ambulatory Visit: Payer: Self-pay

## 2021-05-29 ENCOUNTER — Encounter (HOSPITAL_COMMUNITY): Payer: Self-pay

## 2021-05-29 DIAGNOSIS — D175 Benign lipomatous neoplasm of intra-abdominal organs: Secondary | ICD-10-CM | POA: Insufficient documentation

## 2021-05-29 DIAGNOSIS — D123 Benign neoplasm of transverse colon: Secondary | ICD-10-CM

## 2021-05-29 DIAGNOSIS — Z888 Allergy status to other drugs, medicaments and biological substances status: Secondary | ICD-10-CM | POA: Insufficient documentation

## 2021-05-29 DIAGNOSIS — K635 Polyp of colon: Secondary | ICD-10-CM | POA: Insufficient documentation

## 2021-05-29 DIAGNOSIS — Z79899 Other long term (current) drug therapy: Secondary | ICD-10-CM | POA: Insufficient documentation

## 2021-05-29 DIAGNOSIS — K648 Other hemorrhoids: Secondary | ICD-10-CM | POA: Diagnosis not present

## 2021-05-29 DIAGNOSIS — Z1211 Encounter for screening for malignant neoplasm of colon: Secondary | ICD-10-CM | POA: Diagnosis not present

## 2021-05-29 DIAGNOSIS — Z139 Encounter for screening, unspecified: Secondary | ICD-10-CM | POA: Diagnosis not present

## 2021-05-29 DIAGNOSIS — D124 Benign neoplasm of descending colon: Secondary | ICD-10-CM

## 2021-05-29 HISTORY — PX: COLONOSCOPY WITH PROPOFOL: SHX5780

## 2021-05-29 HISTORY — PX: POLYPECTOMY: SHX5525

## 2021-05-29 SURGERY — COLONOSCOPY WITH PROPOFOL
Anesthesia: General

## 2021-05-29 MED ORDER — LIDOCAINE HCL (CARDIAC) PF 50 MG/5ML IV SOSY
PREFILLED_SYRINGE | INTRAVENOUS | Status: DC | PRN
  Administered 2021-05-29: 50 mg via INTRAVENOUS

## 2021-05-29 MED ORDER — PROPOFOL 10 MG/ML IV BOLUS
INTRAVENOUS | Status: DC | PRN
  Administered 2021-05-29: 200 mg via INTRAVENOUS

## 2021-05-29 MED ORDER — PROPOFOL 500 MG/50ML IV EMUL
INTRAVENOUS | Status: DC | PRN
  Administered 2021-05-29: 75 ug/kg/min via INTRAVENOUS

## 2021-05-29 MED ORDER — LACTATED RINGERS IV SOLN
INTRAVENOUS | Status: DC
  Administered 2021-05-29: 1000 mL via INTRAVENOUS

## 2021-05-29 NOTE — Op Note (Signed)
[facility] Patient Name: Andrew Montes Procedure Date: 05/29/2021 8:21 AM MRN: [id number] Date of Birth: August 10, 1961 Attending MD: Elon Alas. Abbey Chatters DO CSN: [id number] Age: 60 Admit Type: Outpatient Procedure:                Colonoscopy Indications:              Screening for colorectal malignant neoplasm Providers:                Elon Alas. Abbey Chatters, DO, Janeece Riggers, RN, Kristine L.                            Risa Grill, Technician Referring MD:              Medicines:                See the Anesthesia note for documentation of the                            administered medications Complications:            No immediate complications. Estimated Blood Loss:     Estimated blood loss was minimal. Procedure:                Pre-Anesthesia Assessment:                           - The anesthesia plan was to use monitored                            anesthesia care (MAC).                           After obtaining informed consent, the colonoscope                            was passed under direct vision. Throughout the                            procedure, the patient's blood pressure, pulse, and                            oxygen saturations were monitored continuously. The                            [id number]-HQ190L ([id number]) scope was introduced through                            the anus and advanced to the the terminal ileum,                            with identification of the appendiceal orifice and                            IC valve. The colonoscopy was performed without                            difficulty. The patient  tolerated the procedure                            well. The quality of the bowel preparation was                            evaluated using the BBPS [facility] Bowel Preparation                            Scale) with scores of: Right Colon = 3, Transverse                            Colon = 3 and Left Colon = 3 (entire mucosa seen                            well with no  residual staining, small fragments of                            stool or opaque liquid). The total BBPS score                            equals 9. Scope In: 8:35:46 AM Scope Out: 8:49:16 AM Scope Withdrawal Time: 0 hours 10 minutes 19 seconds  Total Procedure Duration: 0 hours 13 minutes 30 seconds  Findings:      The perianal and digital rectal examinations were normal.      Non-bleeding internal hemorrhoids were found during retroflexion.      A 2 mm polyp was found in the transverse colon. The polyp was sessile.       The polyp was removed with a cold biopsy forceps. Resection and       retrieval were complete.      A 4 mm polyp was found in the descending colon. The polyp was sessile.       The polyp was removed with a cold snare. Resection and retrieval were       complete.      The terminal ileum appeared normal.      The exam was otherwise without abnormality. Impression:               - Non-bleeding internal hemorrhoids.                           - One 2 mm polyp in the transverse colon, removed                            with a cold biopsy forceps. Resected and retrieved.                           - One 4 mm polyp in the descending colon, removed                            with a cold snare. Resected and retrieved.                           - The examined portion  of the ileum was normal.                           - The examination was otherwise normal. Moderate Sedation:      Per Anesthesia Care Recommendation:           - Patient has a contact number available for                            emergencies. The signs and symptoms of potential                            delayed complications were discussed with the                            patient. Return to normal activities tomorrow.                            Written discharge instructions were provided to the                            patient.                           - Resume previous diet.                           -  Continue present medications.                           - Await pathology results.                           - Repeat colonoscopy in 5 years for surveillance.                           - Return to GI clinic PRN. Procedure Code(s):        --- Professional ---                           [phone], Colonoscopy, flexible; with removal of                            tumor(s), polyp(s), or other lesion(s) by snare                            technique                           45380, 68, Colonoscopy, flexible; with biopsy,                            single or multiple Diagnosis Code(s):        --- Professional ---                           Z12.11, Encounter for screening for malignant  neoplasm of colon                           K63.5, Polyp of colon                           K64.8, Other hemorrhoids CPT copyright 2019 American Medical Association. All rights reserved. The codes documented in this report are preliminary and upon coder review may  be revised to meet current compliance requirements. Elon Alas. Abbey Chatters, DO [place] Abbey Chatters, DO 05/29/2021 8:51:50 AM This report has been signed electronically. Number of Addenda: 0

## 2021-05-29 NOTE — Anesthesia Procedure Notes (Signed)
Date/Time: 05/29/2021 8:32 AM Performed by: Vista Deck, CRNA Pre-anesthesia Checklist: Patient identified, Emergency Drugs available, Suction available, Timeout performed and Patient being monitored Patient Re-evaluated:Patient Re-evaluated prior to induction Oxygen Delivery Method: Nasal Cannula

## 2021-05-29 NOTE — Discharge Instructions (Addendum)
  Colonoscopy Discharge Instructions  Read the instructions outlined below and refer to this sheet in the next few weeks. These discharge instructions provide you with general information on caring for yourself after you leave the hospital. Your doctor may also give you specific instructions. While your treatment has been planned according to the most current medical practices available, unavoidable complications occasionally occur.   ACTIVITY You may resume your regular activity, but move at a slower pace for the next 24 hours.  Take frequent rest periods for the next 24 hours.  Walking will help get rid of the air and reduce the bloated feeling in your belly (abdomen).  No driving for 24 hours (because of the medicine (anesthesia) used during the test).   Do not sign any important legal documents or operate any machinery for 24 hours (because of the anesthesia used during the test).  NUTRITION Drink plenty of fluids.  You may resume your normal diet as instructed by your doctor.  Begin with a light meal and progress to your normal diet. Heavy or fried foods are harder to digest and may make you feel sick to your stomach (nauseated).  Avoid alcoholic beverages for 24 hours or as instructed.  MEDICATIONS You may resume your normal medications unless your doctor tells you otherwise.  WHAT YOU CAN EXPECT TODAY Some feelings of bloating in the abdomen.  Passage of more gas than usual.  Spotting of blood in your stool or on the toilet paper.  IF YOU HAD POLYPS REMOVED DURING THE COLONOSCOPY: No aspirin products for 7 days or as instructed.  No alcohol for 7 days or as instructed.  Eat a soft diet for the next 24 hours.  FINDING OUT THE RESULTS OF YOUR TEST Not all test results are available during your visit. If your test results are not back during the visit, make an appointment with your caregiver to find out the results. Do not assume everything is normal if you have not heard from your  caregiver or the medical facility. It is important for you to follow up on all of your test results.  SEEK IMMEDIATE MEDICAL ATTENTION IF: You have more than a spotting of blood in your stool.  Your belly is swollen (abdominal distention).  You are nauseated or vomiting.  You have a temperature over 101.  You have abdominal pain or discomfort that is severe or gets worse throughout the day.   Your colonoscopy revealed 2 polyp(s) which I removed successfully. Await pathology results, my office will contact you. I recommend repeating colonoscopy in 5 years for surveillance purposes. Otherwise follow up with GI as needed.    I hope you have a great rest of your week!  Charles K. Carver, D.O. Gastroenterology and Hepatology [facility]  

## 2021-05-29 NOTE — Anesthesia Postprocedure Evaluation (Signed)
Anesthesia Post Note  Patient: Andrew Montes  Procedure(s) Performed: COLONOSCOPY WITH PROPOFOL POLYPECTOMY  Patient location during evaluation: Phase II Anesthesia Type: General Level of consciousness: awake and alert and oriented Pain management: pain level controlled Vital Signs Assessment: post-procedure vital signs reviewed and stable Respiratory status: spontaneous breathing and respiratory function stable Cardiovascular status: blood pressure returned to baseline and stable Postop Assessment: no apparent nausea or vomiting Anesthetic complications: no   No notable events documented.   Last Vitals:  Vitals:   05/29/21 0658 05/29/21 0856  BP: (!) 141/89 112/63  Pulse: 62   Resp: 12   Temp: 36.6 C (!) 36.4 C  SpO2: 96% 97%    Last Pain:  Vitals:   05/29/21 0856  TempSrc: Oral  PainSc: 0-No pain                 Donalda Job C Kross [place]

## 2021-05-29 NOTE — H&P (Signed)
Primary Care Physician:  [facility] Primary Gastroenterologist:  Dr. Abbey Chatters  Pre-Procedure History & Physical: HPI:  Andrew Montes is a 60 y.o. male is here for a colonoscopy for colon cancer screening purposes.  Patient denies any family history of colorectal cancer.  No melena or hematochezia.  No abdominal pain or unintentional weight loss.  No change in bowel habits.  Overall feels well from a GI standpoint.  Past Medical History:  Diagnosis Date   Medical history non-contributory     Past Surgical History:  Procedure Laterality Date   COLONOSCOPY  2012   VASECTOMY  2010    Prior to Admission medications   Medication Sig Start Date End Date Taking? Authorizing Provider  atorvastatin (LIPITOR) 20 MG tablet TAKE 1 TABLET BY MOUTH ONCE DAILY. 06/24/20 07/30/21 Yes Kristen Loader, FNP  Cyanocobalamin (B-12) 3000 MCG CAPS Take 3,000 mcg by mouth daily.   Yes [provider]  Multiple Vitamins-Minerals (MULTIVITAMIN ADULTS PO) Take 1 tablet by mouth daily.   Yes [provider]    Allergies as of 05/22/2021 - Review Complete 05/22/2021  Allergen Reaction Noted   Nystatin Rash 06/30/2011    History reviewed. No pertinent family history.  Social History   Socioeconomic History   Marital status: Married    Spouse name: Not on file   Number of children: Not on file   Years of education: Not on file   Highest education level: Not on file  Occupational History   Not on file  Tobacco Use   Smoking status: Never    Passive exposure: Never   Smokeless tobacco: Never  Substance and Sexual Activity   Alcohol use: Yes    Alcohol/week: 1.0 standard drink    Types: 1 Glasses of wine per week   Drug use: Never   Sexual activity: Not on file  Other Topics Concern   Not on file  Social History Narrative   Not on file   Social Determinants of Health   Financial Resource Strain: Not on file  Food Insecurity: Not on file  Transportation  Needs: Not on file  Physical Activity: Not on file  Stress: Not on file  Social Connections: Not on file  Intimate Partner Violence: Not on file    Review of Systems: See HPI, otherwise negative ROS  Physical Exam: Vital signs in last 24 hours: Temp:  [97.9 F (36.6 C)] 97.9 F (36.6 C) (09/16 0658) Pulse Rate:  [62] 62 (09/16 0658) Resp:  [12] 12 (09/16 0658) BP: (141)/(89) 141/89 (09/16 0658) SpO2:  [96 %] 96 % (09/16 0658)   General:   Alert,  Well-developed, well-nourished, pleasant and cooperative in NAD Head:  Normocephalic and atraumatic. Eyes:  Sclera clear, no icterus.   Conjunctiva pink. Ears:  Normal auditory acuity. Nose:  No deformity, discharge,  or lesions. Mouth:  No deformity or lesions, dentition normal. Neck:  Supple; no masses or thyromegaly. Lungs:  Clear throughout to auscultation.   No wheezes, crackles, or rhonchi. No acute distress. Heart:  Regular rate and rhythm; no murmurs, clicks, rubs,  or gallops. Abdomen:  Soft, nontender and nondistended. No masses, hepatosplenomegaly or hernias noted. Normal bowel sounds, without guarding, and without rebound.   Msk:  Symmetrical without gross deformities. Normal posture. Extremities:  Without clubbing or edema. Neurologic:  Alert and  oriented x4;  grossly normal neurologically. Skin:  Intact without significant lesions or rashes. Cervical Nodes:  No significant cervical adenopathy. Psych:  Alert and  cooperative. Normal mood and affect.  Impression/Plan: Andrew Montes is here for a colonoscopy to be performed for colon cancer screening purposes.  The risks of the procedure including infection, bleed, or perforation as well as benefits, limitations, alternatives and imponderables have been reviewed with the patient. Questions have been answered. All parties agreeable.

## 2021-05-29 NOTE — Anesthesia Preprocedure Evaluation (Signed)
Anesthesia Evaluation  Patient identified by MRN, date of birth, ID band Patient awake    Reviewed: Allergy & Precautions, NPO status , Patient's Chart, lab work & pertinent test results  Airway Mallampati: II  TM Distance: >3 FB Neck ROM: Full    Dental  (+) Dental Advisory Given, Teeth Intact   Pulmonary neg pulmonary ROS,    Pulmonary exam normal breath sounds clear to auscultation       Cardiovascular negative cardio ROS Normal cardiovascular exam Rhythm:Regular Rate:Normal     Neuro/Psych negative neurological ROS  negative psych ROS   GI/Hepatic negative GI ROS, Neg liver ROS,   Endo/Other  negative endocrine ROS  Renal/GU negative Renal ROS  negative genitourinary   Musculoskeletal negative musculoskeletal ROS (+)   Abdominal   Peds negative pediatric ROS (+)  Hematology negative hematology ROS (+)   Anesthesia Other Findings   Reproductive/Obstetrics negative OB ROS                             Anesthesia Physical Anesthesia Plan  ASA: 1  Anesthesia Plan: General   Post-op Pain Management:    Induction: Intravenous  PONV Risk Score and Plan: Propofol infusion  Airway Management Planned: Nasal Cannula and Natural Airway  Additional Equipment:   Intra-op Plan:   Post-operative Plan:   Informed Consent: I have reviewed the patients History and Physical, chart, labs and discussed the procedure including the risks, benefits and alternatives for the proposed anesthesia with the patient or authorized representative who has indicated his/her understanding and acceptance.     Dental advisory given  Plan Discussed with: CRNA and Surgeon  Anesthesia Plan Comments:         Anesthesia Quick Evaluation

## 2021-05-29 NOTE — Transfer of Care (Signed)
Immediate Anesthesia Transfer of Care Note  Patient: Andrew Montes  Procedure(s) Performed: COLONOSCOPY WITH PROPOFOL BIOPSY  Patient Location: Short Stay  Anesthesia Type:General  Level of Consciousness: sedated and patient cooperative  Airway & Oxygen Therapy: Patient Spontanous Breathing  Post-op Assessment: Report given to RN and Post -op Vital signs reviewed and stable  Post vital signs: Reviewed and stable  Last Vitals:  Vitals Value Taken Time  BP    Temp    Pulse    Resp    SpO2     SEE VITAL SIGN FLOW SHEET Last Pain:  Vitals:   05/29/21 0658  TempSrc: Oral  PainSc: 0-No pain      Patients Stated Pain Goal: 8 ([id number])  Complications: No notable events documented.

## 2021-06-01 LAB — SURGICAL PATHOLOGY

## 2021-06-04 ENCOUNTER — Encounter (HOSPITAL_COMMUNITY): Payer: Self-pay | Admitting: Internal Medicine

## 2021-06-18 ENCOUNTER — Other Ambulatory Visit (HOSPITAL_COMMUNITY): Payer: Self-pay

## 2021-06-18 DIAGNOSIS — F5101 Primary insomnia: Secondary | ICD-10-CM | POA: Diagnosis not present

## 2021-06-18 DIAGNOSIS — L309 Dermatitis, unspecified: Secondary | ICD-10-CM | POA: Diagnosis not present

## 2021-06-18 DIAGNOSIS — E782 Mixed hyperlipidemia: Secondary | ICD-10-CM | POA: Diagnosis not present

## 2021-06-18 DIAGNOSIS — N529 Male erectile dysfunction, unspecified: Secondary | ICD-10-CM | POA: Diagnosis not present

## 2021-06-18 MED ORDER — DESONIDE 0.05 % EX LOTN
1.0000 "application " | TOPICAL_LOTION | Freq: Two times a day (BID) | CUTANEOUS | 1 refills | Status: DC
  Filled 2021-06-18: qty 59, 30d supply, fill #0

## 2021-06-26 ENCOUNTER — Other Ambulatory Visit (HOSPITAL_COMMUNITY): Payer: Self-pay

## 2021-06-26 MED ORDER — BETAMETHASONE VALERATE 0.1 % EX LOTN
1.0000 "application " | TOPICAL_LOTION | Freq: Every day | CUTANEOUS | 1 refills | Status: DC
  Filled 2021-06-26: qty 60, 60d supply, fill #0

## 2021-06-29 ENCOUNTER — Other Ambulatory Visit (HOSPITAL_COMMUNITY): Payer: Self-pay

## 2021-08-06 ENCOUNTER — Other Ambulatory Visit (HOSPITAL_COMMUNITY): Payer: Self-pay

## 2021-08-07 ENCOUNTER — Other Ambulatory Visit (HOSPITAL_COMMUNITY): Payer: Self-pay

## 2021-08-10 ENCOUNTER — Other Ambulatory Visit (HOSPITAL_COMMUNITY): Payer: Self-pay

## 2021-08-10 MED ORDER — ATORVASTATIN CALCIUM 20 MG PO TABS
20.0000 mg | ORAL_TABLET | Freq: Every day | ORAL | 1 refills | Status: DC
  Filled 2021-08-10: qty 90, 90d supply, fill #0
  Filled 2021-11-24: qty 90, 90d supply, fill #1

## 2021-08-11 ENCOUNTER — Other Ambulatory Visit (HOSPITAL_COMMUNITY): Payer: Self-pay

## 2021-08-12 ENCOUNTER — Other Ambulatory Visit (HOSPITAL_COMMUNITY): Payer: Self-pay

## 2021-11-24 ENCOUNTER — Other Ambulatory Visit (HOSPITAL_COMMUNITY): Payer: Self-pay

## 2021-12-01 ENCOUNTER — Other Ambulatory Visit (HOSPITAL_COMMUNITY): Payer: Self-pay

## 2022-01-22 ENCOUNTER — Other Ambulatory Visit (HOSPITAL_COMMUNITY): Payer: Self-pay

## 2022-01-22 DIAGNOSIS — M25561 Pain in right knee: Secondary | ICD-10-CM | POA: Diagnosis not present

## 2022-01-22 MED ORDER — MELOXICAM 15 MG PO TABS
15.0000 mg | ORAL_TABLET | Freq: Every day | ORAL | 1 refills | Status: DC
  Filled 2022-01-22: qty 30, 30d supply, fill #0

## 2022-02-19 ENCOUNTER — Other Ambulatory Visit (HOSPITAL_COMMUNITY): Payer: Self-pay

## 2022-02-19 DIAGNOSIS — Z125 Encounter for screening for malignant neoplasm of prostate: Secondary | ICD-10-CM | POA: Diagnosis not present

## 2022-02-19 DIAGNOSIS — I10 Essential (primary) hypertension: Secondary | ICD-10-CM | POA: Diagnosis not present

## 2022-02-19 DIAGNOSIS — E782 Mixed hyperlipidemia: Secondary | ICD-10-CM | POA: Diagnosis not present

## 2022-02-19 DIAGNOSIS — F5101 Primary insomnia: Secondary | ICD-10-CM | POA: Diagnosis not present

## 2022-02-19 DIAGNOSIS — Z0000 Encounter for general adult medical examination without abnormal findings: Secondary | ICD-10-CM | POA: Diagnosis not present

## 2022-02-19 DIAGNOSIS — N529 Male erectile dysfunction, unspecified: Secondary | ICD-10-CM | POA: Diagnosis not present

## 2022-02-19 MED ORDER — LOSARTAN POTASSIUM 50 MG PO TABS
50.0000 mg | ORAL_TABLET | Freq: Every day | ORAL | 1 refills | Status: AC
  Filled 2022-02-19: qty 90, 90d supply, fill #0

## 2022-02-22 ENCOUNTER — Other Ambulatory Visit (HOSPITAL_COMMUNITY): Payer: Self-pay

## 2022-02-22 MED ORDER — ATORVASTATIN CALCIUM 20 MG PO TABS
20.0000 mg | ORAL_TABLET | Freq: Every day | ORAL | 3 refills | Status: AC
  Filled 2022-02-22: qty 90, 90d supply, fill #0

## 2022-03-04 DIAGNOSIS — M25561 Pain in right knee: Secondary | ICD-10-CM | POA: Diagnosis not present

## 2022-03-15 DIAGNOSIS — Z111 Encounter for screening for respiratory tuberculosis: Secondary | ICD-10-CM | POA: Diagnosis not present

## 2022-03-15 DIAGNOSIS — Z23 Encounter for immunization: Secondary | ICD-10-CM | POA: Diagnosis not present

## 2022-03-15 DIAGNOSIS — Z0184 Encounter for antibody response examination: Secondary | ICD-10-CM | POA: Diagnosis not present

## 2022-10-21 DIAGNOSIS — N529 Male erectile dysfunction, unspecified: Secondary | ICD-10-CM | POA: Diagnosis not present

## 2022-10-21 DIAGNOSIS — R0681 Apnea, not elsewhere classified: Secondary | ICD-10-CM | POA: Diagnosis not present

## 2022-10-21 DIAGNOSIS — I10 Essential (primary) hypertension: Secondary | ICD-10-CM | POA: Diagnosis not present

## 2022-10-29 DIAGNOSIS — G4719 Other hypersomnia: Secondary | ICD-10-CM | POA: Diagnosis not present

## 2022-11-25 DIAGNOSIS — G4733 Obstructive sleep apnea (adult) (pediatric): Secondary | ICD-10-CM | POA: Diagnosis not present

## 2022-12-20 ENCOUNTER — Other Ambulatory Visit (HOSPITAL_COMMUNITY): Payer: Self-pay

## 2022-12-20 MED ORDER — TADALAFIL 10 MG PO TABS
10.0000 mg | ORAL_TABLET | Freq: Every day | ORAL | 1 refills | Status: DC
  Filled 2022-12-20: qty 30, 30d supply, fill #0
  Filled 2023-01-18: qty 30, 30d supply, fill #1

## 2022-12-20 MED ORDER — LOSARTAN POTASSIUM 50 MG PO TABS
50.0000 mg | ORAL_TABLET | Freq: Two times a day (BID) | ORAL | 1 refills | Status: DC
  Filled 2022-12-20: qty 60, 30d supply, fill #0
  Filled 2023-01-18: qty 60, 30d supply, fill #1
  Filled 2023-02-16: qty 60, 30d supply, fill #2

## 2022-12-22 ENCOUNTER — Other Ambulatory Visit (HOSPITAL_COMMUNITY): Payer: Self-pay

## 2023-01-19 ENCOUNTER — Other Ambulatory Visit (HOSPITAL_COMMUNITY): Payer: Self-pay

## 2023-01-19 ENCOUNTER — Other Ambulatory Visit: Payer: Self-pay

## 2023-01-21 ENCOUNTER — Other Ambulatory Visit (HOSPITAL_COMMUNITY): Payer: Self-pay

## 2023-02-17 ENCOUNTER — Other Ambulatory Visit (HOSPITAL_COMMUNITY): Payer: Self-pay

## 2023-06-14 ENCOUNTER — Telehealth: Payer: Self-pay | Admitting: Internal Medicine

## 2023-06-15 ENCOUNTER — Encounter: Payer: Self-pay | Admitting: Internal Medicine

## 2023-06-15 ENCOUNTER — Ambulatory Visit (INDEPENDENT_AMBULATORY_CARE_PROVIDER_SITE_OTHER): Payer: PRIVATE HEALTH INSURANCE | Admitting: Internal Medicine

## 2023-06-15 VITALS — BP 109/72 | HR 67 | Temp 97.9°F | Ht 67.0 in | Wt 180.0 lb

## 2023-06-15 DIAGNOSIS — R140 Abdominal distension (gaseous): Secondary | ICD-10-CM

## 2023-06-15 DIAGNOSIS — R109 Unspecified abdominal pain: Secondary | ICD-10-CM | POA: Diagnosis not present

## 2023-06-15 DIAGNOSIS — R152 Fecal urgency: Secondary | ICD-10-CM

## 2023-06-15 DIAGNOSIS — K529 Noninfective gastroenteritis and colitis, unspecified: Secondary | ICD-10-CM | POA: Diagnosis not present

## 2023-06-15 DIAGNOSIS — R197 Diarrhea, unspecified: Secondary | ICD-10-CM

## 2023-06-15 NOTE — Progress Notes (Signed)
Referring Provider: [facility] Primary Care Physician:  [facility] Primary GI:  Dr. Marletta Lor  Chief Complaint  Patient presents with   Diarrhea    Patient arrives today with concerns of change in bowel habits, diarrhea and IBS.     HPI:   Andrew Montes is a 62 y.o. male who presents to the clinic today to discuss GI issues.  States over the last 6 to 12 months he has had more issues with his bowels.  Notes chronically loose stools sometimes up to 4 times daily.  Primarily after he eats a meal he will get sudden urgency to run to the bathroom to have a BM.  Occasionally will have formed stool.  No melena hematochezia.  Has taken Imodium in a reactive manner which does seem to help some.  Also notes associated bloating and abdominal pain/cramping.  Relieved with bowel movements.  Mild to moderate, intermittent.  Colonoscopy 05/29/2021 with multiple polyps removed, benign hyperplastic, benign intramucosal lipoma.  Continue recall.  Past Medical History:  Diagnosis Date   Medical history non-contributory     Past Surgical History:  Procedure Laterality Date   COLONOSCOPY  2012   COLONOSCOPY WITH PROPOFOL N/A 05/29/2021   Procedure: COLONOSCOPY WITH PROPOFOL;  Surgeon: Lanelle Bal, DO;  [facility];  Service: Endoscopy;  Laterality: N/A;  8:00   POLYPECTOMY  05/29/2021   Procedure: POLYPECTOMY;  Surgeon: Lanelle Bal, DO;  Location: [facility];  Service: Endoscopy;;  transverse,descending   VASECTOMY  2010    Current Outpatient Medications  Medication Sig Dispense Refill   atorvastatin (LIPITOR) 20 MG tablet Take 1 tablet (20 mg total) by mouth daily. 90 tablet 3   losartan (COZAAR) 50 MG tablet Take 1 tablet (50 mg total) by mouth daily. 90 tablet 1   Multiple Vitamins-Minerals (MULTIVITAMIN ADULTS PO) Take 1 tablet by mouth daily.     tadalafil (CIALIS) 10 MG tablet Take by mouth. Patient reports taking 12.5mg   daily     No current facility-administered medications for this visit.    Allergies as of 06/15/2023 - Review Complete 06/15/2023  Allergen Reaction Noted   Nystatin Rash 06/30/2011    No family history on file.  Social History   Socioeconomic History   Marital status: Married    Spouse name: Not on file   Number of children: Not on file   Years of education: Not on file   Highest education level: Not on file  Occupational History   Not on file  Tobacco Use   Smoking status: Never    Passive exposure: Never   Smokeless tobacco: Never  Substance and Sexual Activity   Alcohol use: Yes    Alcohol/week: 1.0 standard drink of alcohol    Types: 1 Glasses of wine per week   Drug use: Never   Sexual activity: Not on file  Other Topics Concern   Not on file  Social History Narrative   Not on file   Social Determinants of Health   Financial Resource Strain: Not on file  Food Insecurity: Not on file  Transportation Needs: Not on file  Physical Activity: Not on file  Stress: Not on file  Social Connections: Not on file    Subjective: Review of Systems  Constitutional:  Negative for chills and fever.  HENT:  Negative for congestion and hearing loss.   Eyes:  Negative for blurred vision and double vision.  Respiratory:  Negative  for cough and shortness of breath.   Cardiovascular:  Negative for chest pain and palpitations.  Gastrointestinal:  Positive for abdominal pain and diarrhea. Negative for blood in stool, constipation, heartburn, melena and vomiting.  Genitourinary:  Negative for dysuria and urgency.  Musculoskeletal:  Negative for joint pain and myalgias.  Skin:  Negative for itching and rash.  Neurological:  Negative for dizziness and headaches.  Psychiatric/Behavioral:  Negative for depression. The patient is not nervous/anxious.      Objective: BP 109/72 (BP Location: Left Arm, Patient Position: Sitting, Cuff Size: Large)   Pulse 67   Temp 97.9 F (36.6  C) (Oral)   Ht 5\' 7"  (1.702 m)   Wt 180 lb (81.6 kg)   BMI 28.19 kg/m  Physical Exam Constitutional:      Appearance: Normal appearance.  HENT:     Head: Normocephalic and atraumatic.  Eyes:     Extraocular Movements: Extraocular movements intact.     Conjunctiva/sclera: Conjunctivae normal.  Cardiovascular:     Rate and Rhythm: Normal rate and regular rhythm.  Pulmonary:     Effort: Pulmonary effort is normal.     Breath sounds: Normal breath sounds.  Abdominal:     General: Bowel sounds are normal.     Palpations: Abdomen is soft.  Musculoskeletal:        General: Normal range of motion.     Cervical back: Normal range of motion and neck supple.  Skin:    General: Skin is warm.  Neurological:     General: No focal deficit present.     Mental Status: He is alert and oriented to person, place, and time.  Psychiatric:        Mood and Affect: Mood normal.        Behavior: Behavior normal.      Assessment: *Chronic diarrhea *Abdominal bloating/cramping *Fecal urgency  Plan: Patient's symptoms likely irritable bowel syndrome, diarrhea predominant.  Will check celiac testing today, alpha gal, TSH, ESR/CRP.  Continue Imodium as needed.  Consider trial of Viberzi.  May need to repeat colonoscopy with colon biopsies rule out microscopic colitis.  Further recommendations to follow  06/15/2023 3:19 PM   Disclaimer: This note was dictated with voice recognition software. Similar sounding words can inadvertently be transcribed and may not be corrected upon review.

## 2023-06-15 NOTE — Patient Instructions (Signed)
I am going to order blood work at [facility] today including celiac testing, alpha gal testing, few inflammatory markers, and thyroid.  We will call with results.  It was very nice seeing you today.  Dr. Marletta Lor

## 2023-06-22 LAB — C-REACTIVE PROTEIN: CRP: 3.3 mg/L (ref <8.0)

## 2023-06-22 LAB — CELIAC DISEASE PANEL
(tTG) Ab, IgA: <1 U/mL
(tTG) Ab, IgG: <1 U/mL
Gliadin IgA: 5.2 U/mL
Gliadin IgG: <1 U/mL
Immunoglobulin A: 98 mg/dL (ref 70–320)

## 2023-06-22 LAB — ALPHA-GAL PANEL
Allergen, Mutton, f88: <0.1 kU/L
Allergen, Pork, f26: <0.1 kU/L
Beef: <0.1 kU/L
CLASS: 0
CLASS: 0
Class: 0
GALACTOSE-ALPHA-1,3-GALACTOSE IGE*: <0.1 kU/L (ref <0.10)

## 2023-06-22 LAB — INTERPRETATION:

## 2023-06-22 LAB — SEDIMENTATION RATE: Sed Rate: 6 mm/h (ref 0–20)

## 2023-11-13 NOTE — Telephone Encounter (Signed)
Error

## 2023-12-05 ENCOUNTER — Telehealth: Payer: Self-pay | Admitting: Gastroenterology

## 2023-12-05 NOTE — Telephone Encounter (Signed)
 Mandy: can you please arrange a visit for him to see me for hemorrhoid banding? Thanks! I am not sure if active on MyChart or not. Can be any spot that works for him.

## 2023-12-20 ENCOUNTER — Ambulatory Visit (INDEPENDENT_AMBULATORY_CARE_PROVIDER_SITE_OTHER): Payer: PRIVATE HEALTH INSURANCE | Admitting: Gastroenterology

## 2023-12-20 VITALS — BP 123/73 | HR 61 | Temp 97.5°F | Ht 67.0 in | Wt 178.1 lb

## 2023-12-20 DIAGNOSIS — K644 Residual hemorrhoidal skin tags: Secondary | ICD-10-CM | POA: Insufficient documentation

## 2023-12-20 DIAGNOSIS — R197 Diarrhea, unspecified: Secondary | ICD-10-CM | POA: Diagnosis not present

## 2023-12-20 NOTE — Progress Notes (Unsigned)
 Gastroenterology Office Note     Primary Care Physician:  Bradley Ferris  Primary Gastroenterologist: Dr. Marletta Lor    Chief Complaint   Chief Complaint  Patient presents with   Hemorrhoids    Pt here for hemorrhoid banding     History of Present Illness   Andrew Montes is a 63 y.o. male presenting today with a history of hemorrhoids, intermittent diarrhea, last seen in Oct 2024. Here today for possible hemorrhoid banding.  Last colonoscopy 05/2021: multiple polyps removed, benign hyperplastic, benign intramucosal lipoma.   Flare about a month ago. No bleeding. Some irritation. Preparation h. Tucks pads. Sitz bath. No pain. Usually after morning stool will flare. Feels pressure and prolapsed.   Was straining when occurred.    Has intermittent diarrhea. Posptrandial. Gas, bloating. Urgency. Usually with any types of food. Occurs at work also. Takes imodium prn. He wants to hold off on any prescriptions. Prior labs including alpha gal, CRP, sed rate, celiac, all negative.     Past Medical History:  Diagnosis Date   Medical history non-contributory     Past Surgical History:  Procedure Laterality Date   COLONOSCOPY  2012   COLONOSCOPY WITH PROPOFOL N/A 05/29/2021   Procedure: COLONOSCOPY WITH PROPOFOL;  Surgeon: Lanelle Bal, DO;  [facility];  Service: Endoscopy;  Laterality: N/A;  8:00   POLYPECTOMY  05/29/2021   Procedure: POLYPECTOMY;  Surgeon: Lanelle Bal, DO;  [facility]: [facility];  Service: Endoscopy;;  transverse,descending   VASECTOMY  2010    Current Outpatient Medications  Medication Sig Dispense Refill   atorvastatin (LIPITOR) 20 MG tablet Take 1 tablet (20 mg total) by mouth daily. 90 tablet 3   losartan (COZAAR) 50 MG tablet Take 1 tablet (50 mg total) by mouth daily. 90 tablet 1   Multiple Vitamins-Minerals (MULTIVITAMIN ADULTS PO) Take 1 tablet by mouth daily.     tadalafil (CIALIS) 10 MG tablet Take by mouth.  Patient reports taking 12.5mg  daily     No current facility-administered medications for this visit.    Allergies as of 12/20/2023 - Review Complete 12/20/2023  Allergen Reaction Noted   Nystatin Rash 06/30/2011    No family history on file.  Social History   Socioeconomic History   Marital status: Married    Spouse name: Not on file   Number of children: Not on file   Years of education: Not on file   Highest education level: Not on file  Occupational History   Not on file  Tobacco Use   Smoking status: Never    Passive exposure: Never   Smokeless tobacco: Never  Substance and Sexual Activity   Alcohol use: Yes    Alcohol/week: 1.0 standard drink of alcohol    Types: 1 Glasses of wine per week   Drug use: Never   Sexual activity: Not on file  Other Topics Concern   Not on file  Social History Narrative   Not on file   Social Drivers of Health   Financial Resource Strain: Not on file  Food Insecurity: Low Risk  (09/13/2023)   Received from [facility]   Hunger Vital Sign    Worried About Running Out of Food in the Last Year: Never true    Ran Out of Food in the Last Year: Never true  Transportation Needs: No Transportation Needs (09/13/2023)   Received from [facility]    In the past  12 months, has lack of reliable transportation kept you from medical appointments, meetings, work or from getting things needed for daily living? : No  Physical Activity: Not on file  Stress: Not on file  Social Connections: Not on file  Intimate Partner Violence: Not on file     Review of Systems   Gen: Denies any fever, chills, fatigue, weight loss, lack of appetite.  CV: Denies chest pain, heart palpitations, peripheral edema, syncope.  Resp: Denies shortness of breath at rest or with exertion. Denies wheezing or cough.  GI: Denies dysphagia or odynophagia. Denies jaundice, hematemesis, fecal incontinence. GU : Denies urinary burning, urinary  frequency, urinary hesitancy MS: Denies joint pain, muscle weakness, cramps, or limitation of movement.  Derm: Denies rash, itching, dry skin Psych: Denies depression, anxiety, memory loss, and confusion Heme: Denies bruising, bleeding, and enlarged lymph nodes.   Physical Exam   BP 123/73 (BP Location: Left Arm, Patient Position: Sitting, Cuff Size: Normal)   Pulse 61   Temp (!) 97.5 F (36.4 C) (Oral)   Ht 5\' 7"  (1.702 m)   Wt 178 lb 1.6 oz (80.8 kg)   BMI 27.89 kg/m  General:   Alert and oriented. Pleasant and cooperative. Well-nourished and well-developed.  Head:  Normocephalic and atraumatic. Eyes:  Without icterus Abdomen:  +BS, soft, non-tender and non-distended. No HSM noted. No guarding or rebound. No masses appreciated.  Rectal:  small thrombosed resolving right posterior external hemorrhoid, appears to be resorbing, DRE without mass, possible prominent right posterior columns.  Msk:  Symmetrical without gross deformities. Normal posture. Extremities:  Without edema. Neurologic:  Alert and  oriented x4;  grossly normal neurologically. Skin:  Intact without significant lesions or rashes. Psych:  Alert and cooperative. Normal mood and affect.   Assessment   Andrew Montes is a 63 y.o. male presenting today with a history of  hemorrhoids, intermittent diarrhea, last seen in Oct 2024. Here today for possible hemorrhoid banding.  Unfortunately, hemorrhoid is external and appears to be resorbing at this time. Pea-sized shape. No mass on DRE. Will provide prescription strength topical ointment to use.   Regarding intermittent diarrhea: recommend course of Xifaxan if willing in future. He would like to avoid this currently. Consider updated colonoscopy if continues.    PLAN  Compounded Chartered loss adjuster cream with lidocaine Will see in 6 weeks   Gelene Mink, PhD, [facility]

## 2023-12-20 NOTE — Patient Instructions (Signed)
 I'm calling in the compounded cream to [employer]. They will call you to review payment and if insurance covers.   Continue with avoiding straining, limiting toilet time to 2-3 minutes, and avoiding constipation.  We will see you in about 6 weeks to check! If no improvement, please message!  It was a pleasure to see you today. I want to create trusting relationships with patients and provide genuine, compassionate, and quality care. I truly value your feedback, so please be on the lookout for a survey regarding your visit with me today. I appreciate your time in completing this!         Gelene Mink, PhD, ANP-BC [facility]

## 2024-02-02 ENCOUNTER — Ambulatory Visit: Payer: PRIVATE HEALTH INSURANCE | Admitting: Gastroenterology

## 2024-02-14 ENCOUNTER — Ambulatory Visit: Payer: PRIVATE HEALTH INSURANCE | Admitting: Gastroenterology

## 2024-10-19 ENCOUNTER — Other Ambulatory Visit (HOSPITAL_COMMUNITY): Payer: Self-pay | Admitting: Urology

## 2024-10-19 DIAGNOSIS — R972 Elevated prostate specific antigen [PSA]: Secondary | ICD-10-CM
